# Patient Record
Sex: Male | Born: 1950 | Race: White | Hispanic: No | Marital: Married
Health system: Midwestern US, Academic
[De-identification: ages and names within clinical notes are randomized; demographics above are authoritative.]

---

## 2014-10-13 IMAGING — CR CHEST
2 series · 2 of 2 positions shown · non-contrast
Comparison: 21 August, 2009

EXAM: CHEST
REASON FOR EXAM: Fever, chills, sweat
TECHNIQUE: PA   Lateral

[chest pa x-wise]
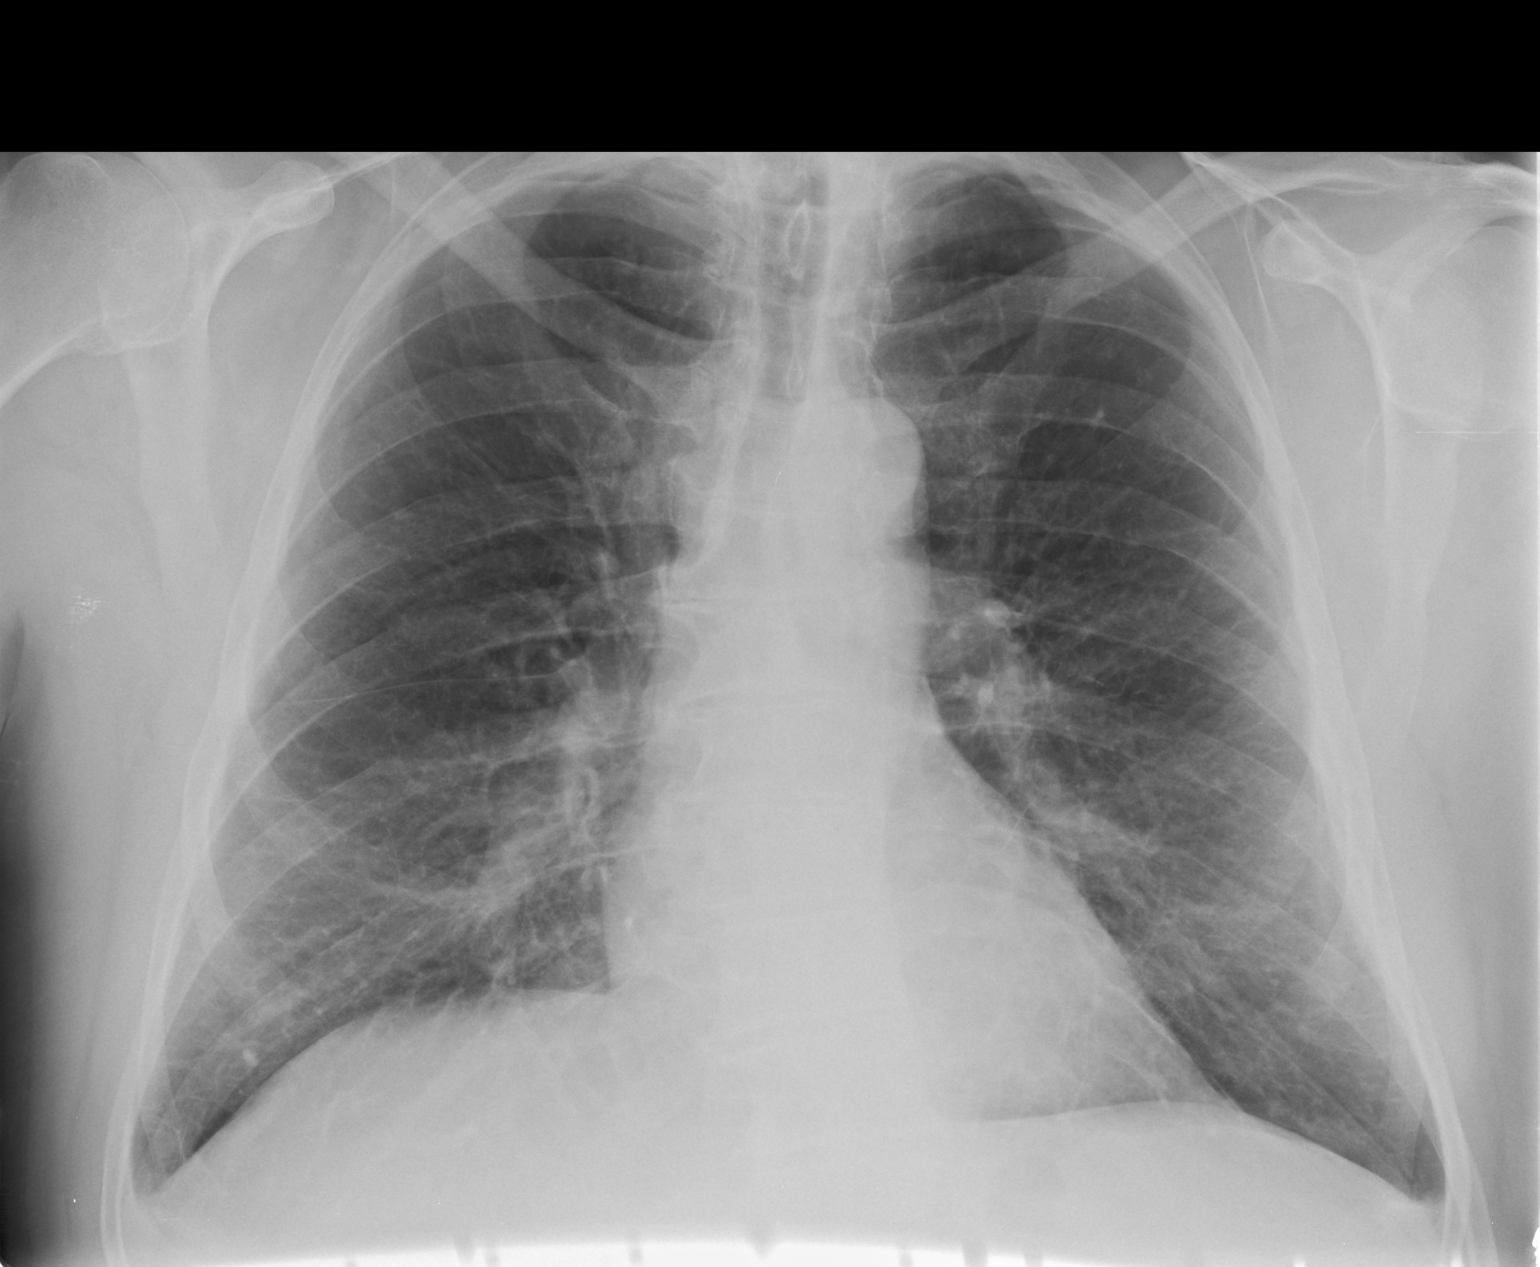

[chest lat]
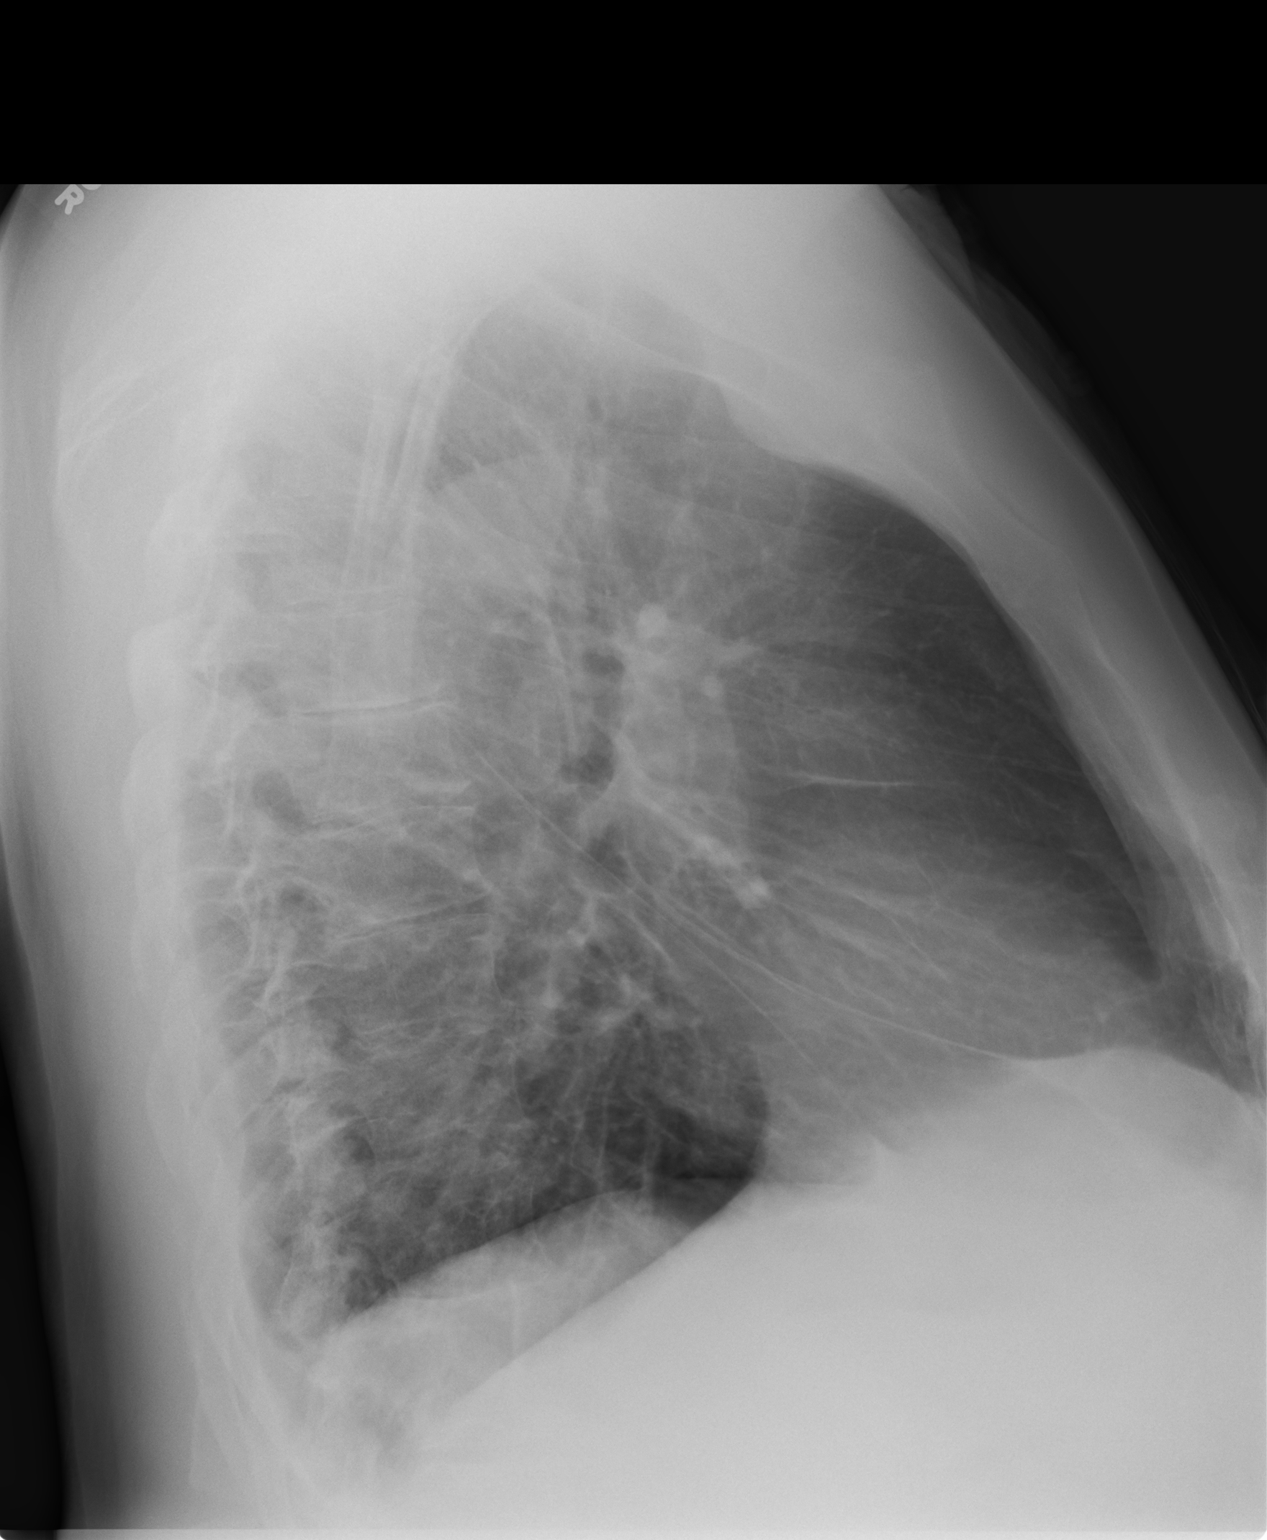

[2 of 2 positions shown; findings below may reference images not displayed]

IMPRESSION: Increased interstitial markings bilateral lung bases, likely representing
infiltrative process.
Cardiac silhouette and pulmonary vasculature within normal limits.
FINDINGS: There are increased interstitial markings within the bilateral lung bases,
likely representing an interstitial pneumonitis.  The cardiac silhouette and
pulmonary vasculature are within normal limits and stable when compared to
prior studies.  There are no effusions.

Dictated by Stefanie, Jackenson
Preliminary report until reviewed and verified by Stuart, Tatyana

Tech Notes: FEVER CHILLS SWEATS. JL/LR

## 2015-09-25 IMAGING — CT Abdomen^1_ABDOMEN_WITH (Adult)
1 of 2 series · 14 of 32 positions shown, 19 images · IV contrast (APPLIED)
Comparison: none

[Series 2: abdomen with 5.0 soft tissue · axial · 0.84mm/px · z∈[-351,-121]mm · 14 of 50 slices shown, 19 images]
[im 3/50  soft-tissue]
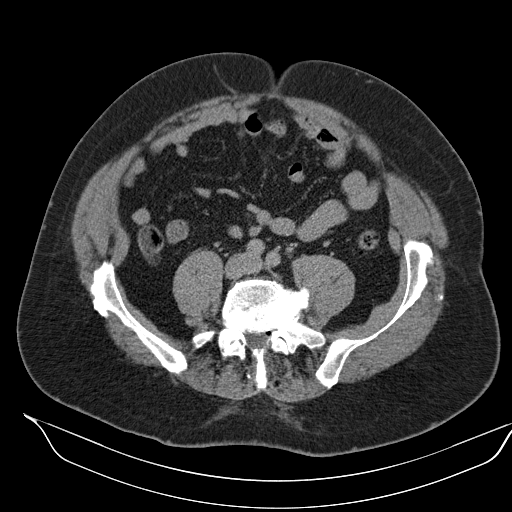
[im 3/50  bone]
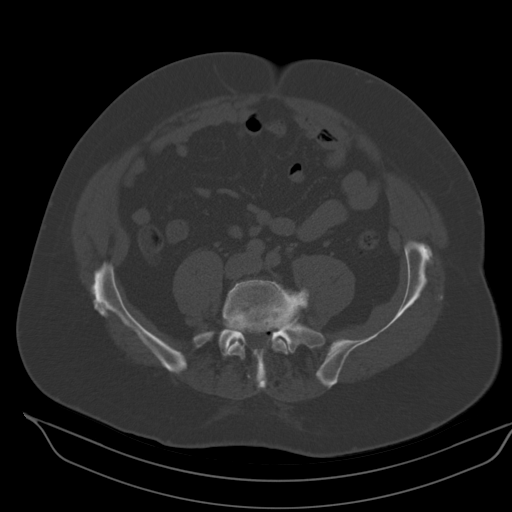
[im 8/50  soft-tissue]
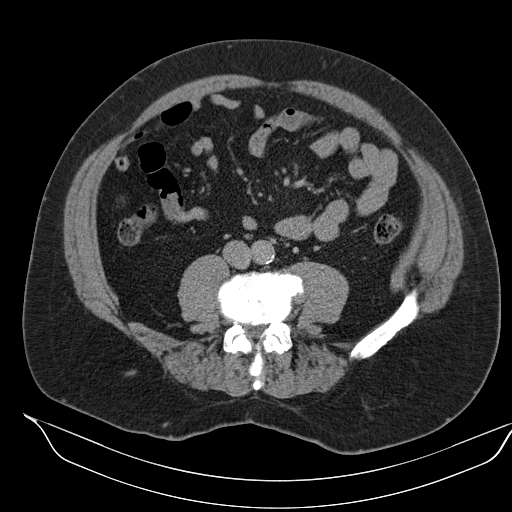
[im 10/50  soft-tissue]
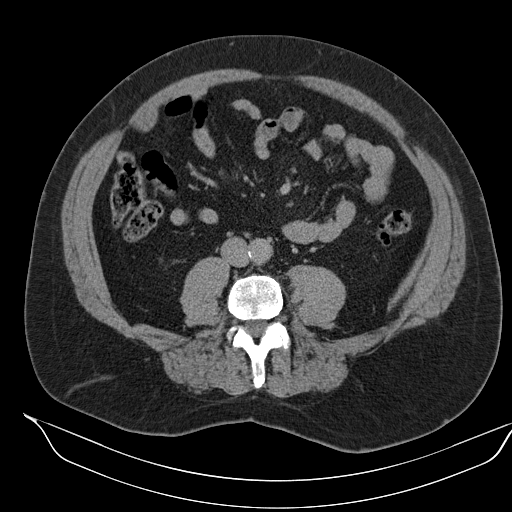
[im 15/50  soft-tissue]
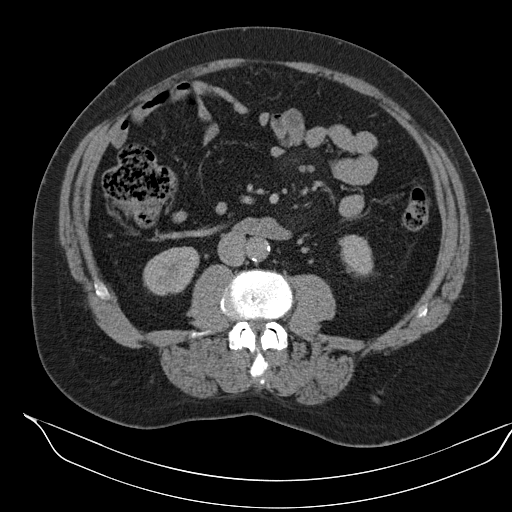
[im 18/50  soft-tissue]
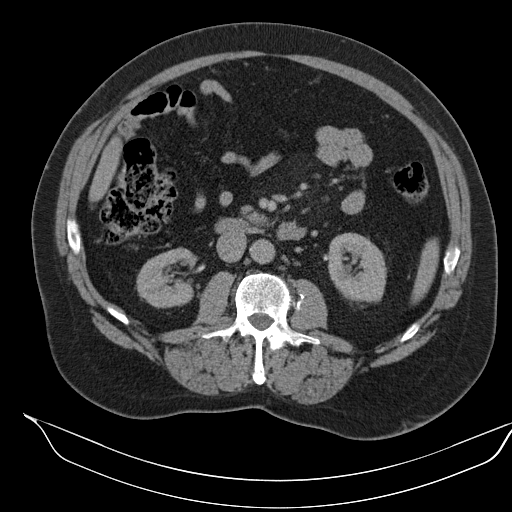
[im 23/50  soft-tissue]
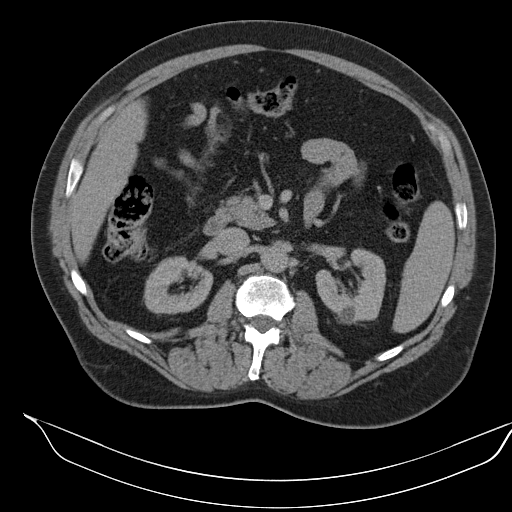
[im 25/50  soft-tissue]
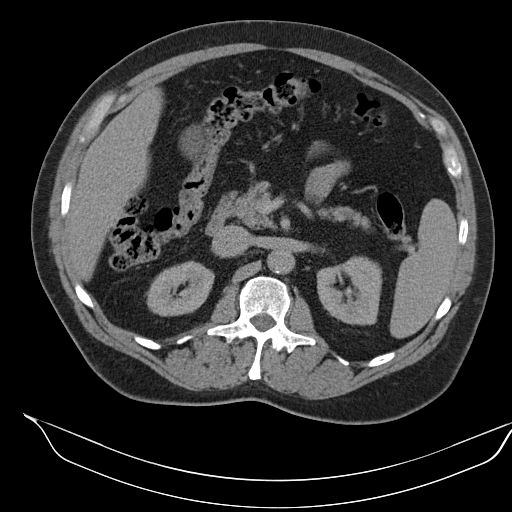
[im 27/50  soft-tissue]
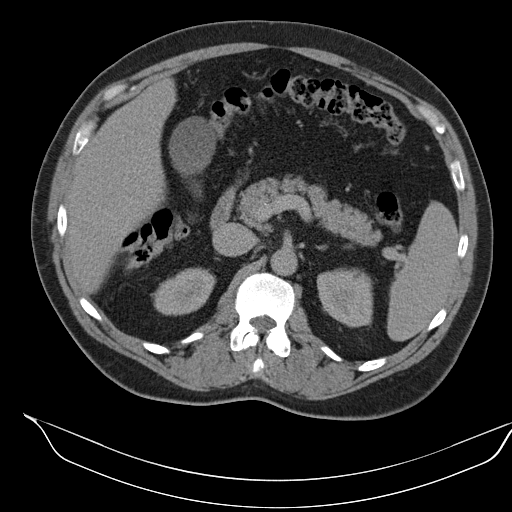
[im 32/50  soft-tissue]
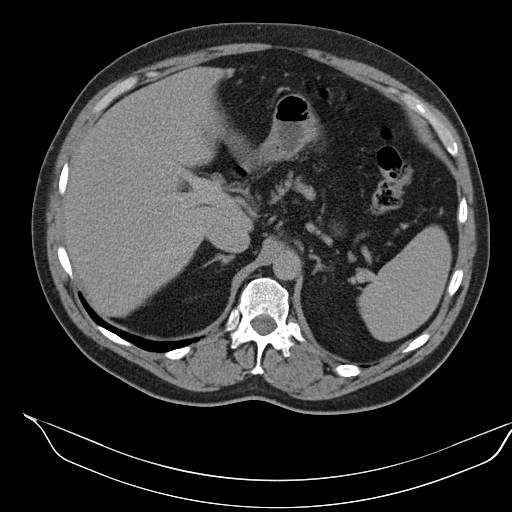
[im 32/50  bone]
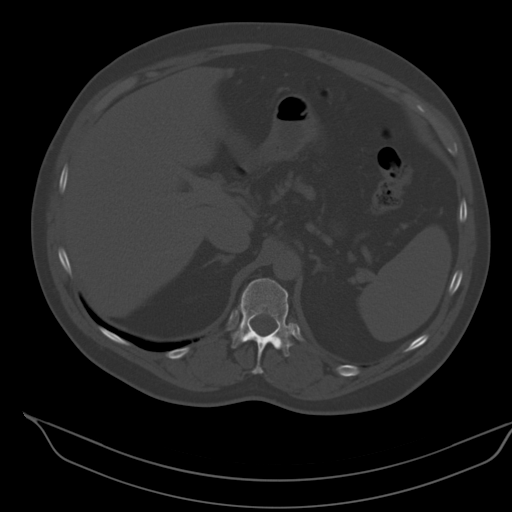
[im 35/50  soft-tissue]
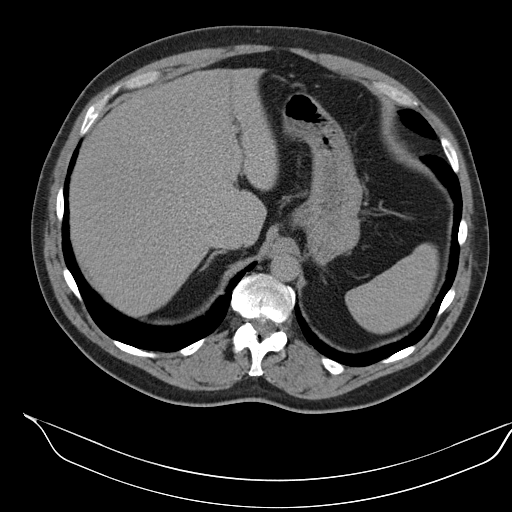
[im 40/50  soft-tissue]
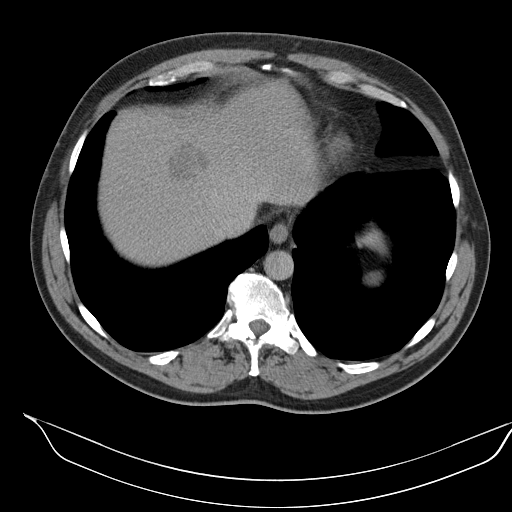
[im 40/50  lung]
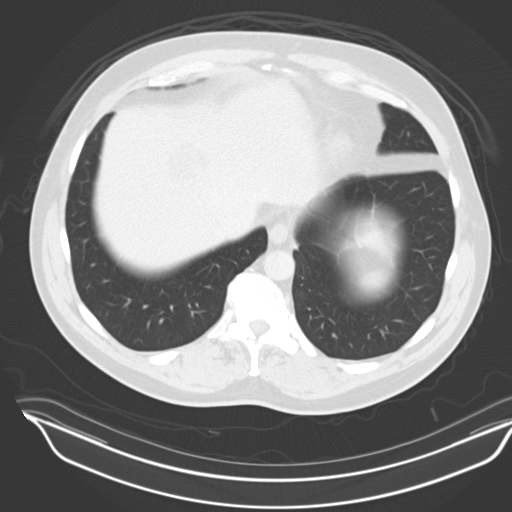
[im 42/50  soft-tissue]
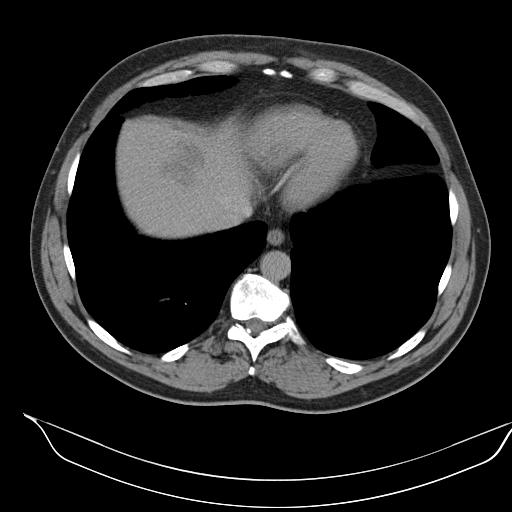
[im 42/50  lung]
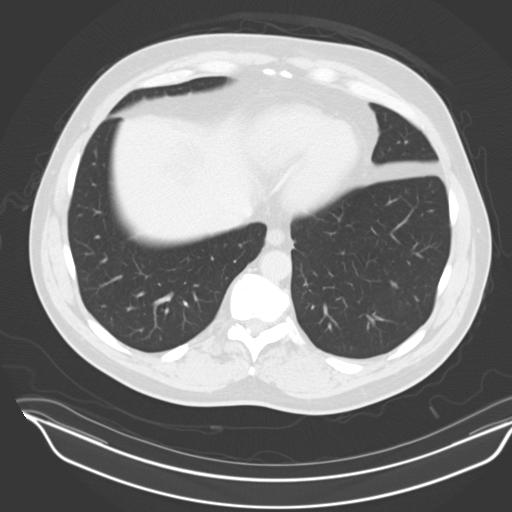
[im 45/50  lung]
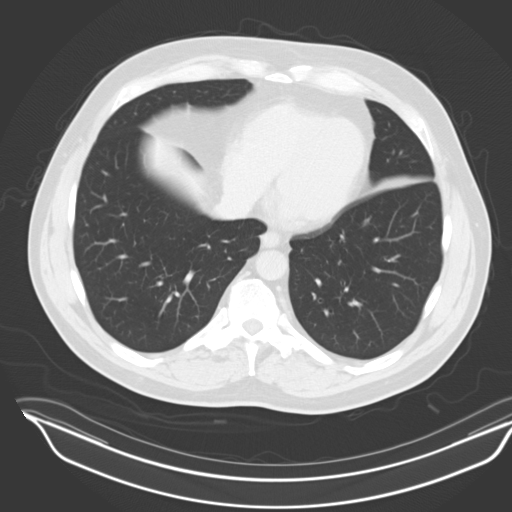
[im 47/50  soft-tissue]
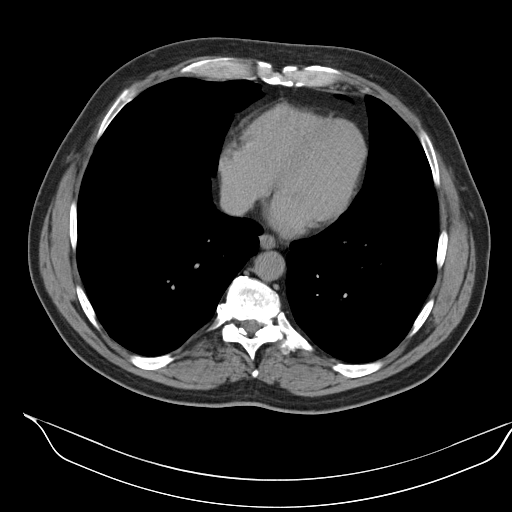
[im 47/50  lung]
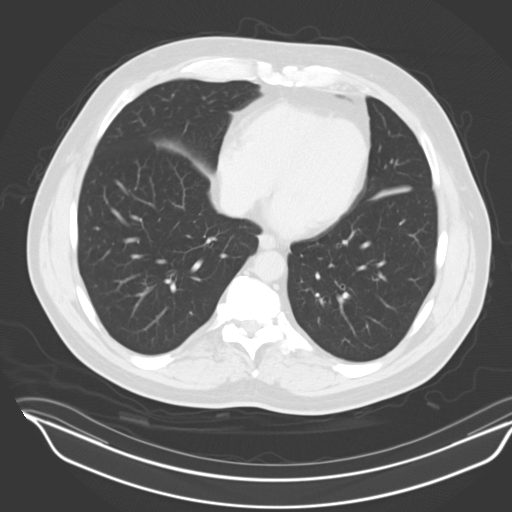

[14 of 32 positions shown; findings below may reference images not displayed]

DIAGNOSTIC STUDIES

EXAM
COMPUTED TOMOGRAPHY, ABDOMEN WITH CONTRAST MATERIAL

INDICATION
liver hemangioma, abd painLIVER HEMANGIOMA. LEFT ABD PAIN, EPIGASTRIC PAIN WORSE IN LAST SIX
MONTHS -AK

TECHNIQUE

All CT scans at this facility use dose modulation, iterative reconstruction, and/or weight based
dosing when appropriate to reduce radiation dose to as low as reasonably achievable.

COMPARISONS
No priors available for comparison.

FINDINGS
[The lung bases are clear. The liver is normal in size without evidence of intrahepatic biliary
ductal dilatation. There is poor enhancement of the hepatic and portal veins. There is a
centimeter low-attenuation lesion within the hepatic dome, image 10. There is the suggestion of
peripheral puddling of contrast on the initial images. The spleen is normal in size. Subtle
punctate calcifications within the pancreas. The gallbladder and adrenal glands appear grossly
unremarkable. There is no hydronephrosis. 1.7 centimeter cyst within the upper pole of the left
kidney. There is no bowel obstruction or free air. Normal appendix. There is a nondistended stomach
with at least mild gastric mucosal thickening. Mild to moderate degree of atherosclerotic disease
within the distal abdominal aorta. Subcentimeter retroperitoneal nodes. There is minimal mesenteric
stranding. There are subcentimeter mesenteric nodes. Severe multilevel degenerative changes within
the lower lumbar spine.

IMPRESSION
1. 3.8 centimeter low-attenuation lesion within the hepatic dome, which may represent a
hemangioma. Comparison to prior studies versus a four-month follow-up can be performed to document
stability.
2. Subtle punctate calcifications within the pancreas, secondary to chronic pancreatitis.
Correlation with the patient's serum amylase and lipase levels is recommended to exclude acute
pancreatitis.
3. Nondistended stomach. There is at least mild gastric mucosal thickening, consistent with
gastritis.

## 2016-09-15 ENCOUNTER — Encounter: Admit: 2016-09-15 | Discharge: 2016-09-15 | Payer: MEDICARE

## 2016-09-16 ENCOUNTER — Encounter: Admit: 2016-09-16 | Discharge: 2016-09-16 | Payer: MEDICARE

## 2016-09-22 ENCOUNTER — Encounter: Admit: 2016-09-22 | Discharge: 2016-09-22 | Payer: MEDICARE

## 2016-09-22 DIAGNOSIS — K219 Gastro-esophageal reflux disease without esophagitis: ICD-10-CM

## 2016-09-22 DIAGNOSIS — Z0389 Encounter for observation for other suspected diseases and conditions ruled out: ICD-10-CM

## 2016-09-22 DIAGNOSIS — IMO0002 Ulcer: Principal | ICD-10-CM

## 2016-09-22 DIAGNOSIS — E785 Hyperlipidemia, unspecified: ICD-10-CM

## 2016-09-22 DIAGNOSIS — N429 Disorder of prostate, unspecified: ICD-10-CM

## 2016-09-22 DIAGNOSIS — R0600 Dyspnea, unspecified: ICD-10-CM

## 2016-09-22 DIAGNOSIS — R12 Heartburn: ICD-10-CM

## 2016-09-22 DIAGNOSIS — N189 Chronic kidney disease, unspecified: ICD-10-CM

## 2016-09-22 DIAGNOSIS — I00 Rheumatic fever without heart involvement: ICD-10-CM

## 2016-09-27 ENCOUNTER — Ambulatory Visit: Admit: 2016-09-27 | Discharge: 2016-09-28 | Payer: MEDICARE

## 2016-09-27 ENCOUNTER — Encounter: Admit: 2016-09-27 | Discharge: 2016-09-27 | Payer: MEDICARE

## 2016-09-27 DIAGNOSIS — E785 Hyperlipidemia, unspecified: ICD-10-CM

## 2016-09-27 DIAGNOSIS — Z9189 Other specified personal risk factors, not elsewhere classified: Principal | ICD-10-CM

## 2016-09-27 DIAGNOSIS — I00 Rheumatic fever without heart involvement: ICD-10-CM

## 2016-09-27 DIAGNOSIS — K219 Gastro-esophageal reflux disease without esophagitis: ICD-10-CM

## 2016-09-27 DIAGNOSIS — N429 Disorder of prostate, unspecified: ICD-10-CM

## 2016-09-27 DIAGNOSIS — R0600 Dyspnea, unspecified: ICD-10-CM

## 2016-09-27 DIAGNOSIS — N189 Chronic kidney disease, unspecified: ICD-10-CM

## 2016-09-27 DIAGNOSIS — Z0389 Encounter for observation for other suspected diseases and conditions ruled out: ICD-10-CM

## 2016-09-27 DIAGNOSIS — IMO0002 Ulcer: Principal | ICD-10-CM

## 2016-09-27 DIAGNOSIS — R12 Heartburn: ICD-10-CM

## 2016-09-27 MED ORDER — ASPIRIN 325 MG PO TBEC
325 mg | ORAL_TABLET | Freq: Every day | ORAL | 3 refills | 30.00000 days | Status: AC
Start: 2016-09-27 — End: 2019-01-03

## 2016-09-27 MED ORDER — ROSUVASTATIN 10 MG PO TAB
ORAL_TABLET | Freq: Every day | ORAL | 3 refills | 90.00000 days | Status: AC
Start: 2016-09-27 — End: 2017-01-23

## 2016-09-27 NOTE — Progress Notes
Date of Service: 09/27/2016    Joe Whitehead is a 66 y.o. male.       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  He has never had any diagnosed coronary disease or even hypertension.  He had stress echoes in 2004 and 2012 that were read by Dr. Kennedy Whitehead.  All findings were normal.  In the past year he has noticed increased exertional dyspnea when walking up a slope in the back of his yard.  This is never been an issue for him under any circumstances until the last year.  He intermittently finds himself short of breath walking in a routine pace for no apparent reason.  He is also noticed some ankle edema that is cyclic daily resolving for the most part at night and recurring each morning and progressing as the day goes on.  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    He states that he is free from exertional chest pain or pressure that limits activity.  He is having no PND, orthopnea,   tachypalpitations, syncope, near syncope, or postural lightheadedness.  He has had no  focal motor defects, speech defects or cognitive defects that would suggest TIA. He's not limiting his activity in order to avoid chest discomfort or shortness of breath with exertion.    His general health is good.  He has had issues with esophageal and pancreatic inflammation.  He is mild elevation in creatinine with GFR slightly under 60 per his history.  He had a  marginally positive sleep study when he was having some sleep disturbances .  He did not tolerate CPAP.  His sleep disturbances have resolved and is not pursuing any further evaluation of possible sleep apnea.  His general health is however good with these caveats. He is not under undue stress.  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    He has had significant arthralgias with both the simvastatin and lovastatin.  The extent of these symptoms I discouraged him from trying alternative therapies with less troublesome drugs at less than daily dosing.   04/01/2016    Cholesterol 217 (H)   Triglycerides 131   HDL 56   LDL 134 (H)     His parents both died of tobacco related diseases.  He is a lifetime non-smoker.  He stopped drinking about a year ago.          Vitals:    09/27/16 0914   BP: 118/72   Pulse: 71   Weight: 115.4 kg (254 lb 6.4 oz)   Height: 1.803 m (5' 11)     Body mass index is 35.48 kg/m???.     Past Medical History  Patient Active Problem List    Diagnosis Date Noted   ??? Hyperlipemia 09/22/2016   ??? GERD (gastroesophageal reflux disease) 09/22/2016   ??? DOE (dyspnea on exertion) 09/22/2016   ??? Observation for suspected cardiovascular disease 09/22/2016     03/19/2010  Exercise Echo:  NSR PVC's noted throughout study.  Normal LV sz and function.  Normal hyperdynamic response for all segments.  No Echocardiographic evidence of ischemia.             Review of Systems   Constitution: Negative.   HENT: Negative.    Eyes: Negative.    Cardiovascular: Negative.    Respiratory: Positive for shortness of breath.    Endocrine: Negative.    Hematologic/Lymphatic: Negative.    Skin: Negative.    Musculoskeletal: Positive for arthritis, back pain, joint pain, muscle cramps and stiffness.   Gastrointestinal: Negative.    Genitourinary: Positive for frequency.   Neurological: Negative.    Psychiatric/Behavioral: Negative.    Allergic/Immunologic: Negative.    14 organ system review noted. It is negative except as reported in current narrative or above in the ROS section. This is a patient centered review of systems that was stated by the patient in his terms prior to my personal problem oriented interview with the patient     Physical Exam  General: Patient in no distress, looks generally healthy. Skin warm and dry.    Mucous membranes moist.  Eyes: Sclera non icteric,Pupils equal and round    Carotids: no bruits    Thyroid not enlarged.  Neck veins: CVP <6 normal, no V wave, no HJR     Respiratory: Breathing comfortably. Lungs clear to percussion & auscultation. No rales, rhonchi or wheezing   Cardiac: Regular rhythm. LV impulse not palpable. Normal S1 & S2, Fourth heart sound, no rub or S3. No murmur  Abdomen: soft, non-tender, no masses,bruits,hepatic or aortic enlargement. + bowel sounds.   Femoral arteries: Good pulses, no bruits.  Legs/feet: Normal PT pulses, 2-3 mm ankle edema extending to midleg  Motor: Normal muscle strength. Cognitive: Pleasant demeanor. Good insight. No depression     Cardiovascular Studies  Today's 12 lead EKG: sinus rhythm, rate 71, one PVC    Problems Addressed Today  No diagnosis found.    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  I did not mention above that he had been doing an exercise program involving stamina training and resistance work but in the last year has cut cut that back to nearly nil and invested this time and getting 7 hour sleep.  The he feels more rested and a healthier with the increase in sleep and reduction exercise.  Nevertheless his symptoms could be related to deconditioning.  Regardless time management is fairly clear that having a regular physical exercise program reduce his risk of coronary disease and some cancers and is thought to perhaps reduce risk of dementia.  I cited all of this data to Dr. Andreas Whitehead who already knew it but had not been processing it for himself    His blood pressure looks good.  I do not think he is been hypertensive at other times.  We will see wall thickness on the echo to be certain that he has not developed LVH since his last stress echo.  At that time he did not have LVH.    I did a calculation of his ASCVD ten-year risk.  His risk came out at 11% dropping to about 8% with initiation of statin based on the results of the [facility]  mobile app.  He agreed that some trial of statin I would be reasonable expecting that with even a moderated dose of statin and Zetia he could get his LDL down by 40-50%.  I recommended that he start generic Crestor 10 mg Monday Wednesday Friday.  If his LDL is not substantively below 100 then I will add Zetia 10 mg daily the expectation that his LDL will likely be around 16% of his baseline once he is on steady state Crestor plus Zetia generics.  I told Dr. Andreas Whitehead that statin to reduce LDL is truly primary prevention or is aspirin only reduces the complications once a flat growth and plaque rupture has occurred.    He will have the stress echo at [facility].  I will make every effort to review the results as soon as they are possibly available.    I recommended that he consider taking aspirin 325 mg daily for colon cancer prevention given his polyps.  He will check with his gastroenterologist Rod Mae on the need to take a higher dose for colon cancer prevention and history of with a history of polyps as her expertise on this greatly exceeds mine.    NB: This document was prepared within the Epic(TM) EMR using templates developed by the [facility]. It can be accessed online through [facility] feature by clinicians at other Epic institutions.  The free text in this document, was generated through Dragon(TM) software.  Editing and proofreading were done by the author of this document Dr. Mable Paris MD, [facility] principally at the point of care.  In spite of the author's best effort to identify every error introduced by voice to text dictation, some errors that may represent misspelling or misstatements of what was dictated may persist.  If there are questions about content in this document please contact Dr. Hale Bogus.    The written information I provided Mr. Plott at the conclusion of today's encounter is as  follows:   Patient Instructions   With your shortness of breath with exertion and the leg edema you need an echo Doppler and a treadmill stress echo.    I do not think the edema require specific treatment right now.  They you could consider 3 times a week hydrochlorothiazide to reduce fluid volume without inducing a volume contraction.  With your tendency for mild rise mild elevation in creatinine I think it is important for you to avoid volume contraction with a diuretic when the edema is truly in need of treatment only for cosmetic and not medical purposes.    Aspirin 325 will be more effective based on my review literature than aspirin 81 for preventing colon cancer development.  Treatment actually takes 5-10 years to become evident so it is better to start now.    Your hypercholesterolemia gives you a 11% ten-year risk.  You can reduce this by about 25-30% by starting Crestor dose it will reduce LDL substantively below 100 try 10 mg daily or if you wish at 10 mg 3 times a week to avoid myalgias.  We can add Zetia 10 daily if LDL reduction on your preferred dose of Crestor is insufficient.    Your blood pressure looks good.  You had no evidence of LVH on prior echoes I suspect the echo we get in the near future will also show normal wall thickness.    3 hours a week of some form of physical activity is advisable.  If she can  hit 10,000 steps a day on your fit bit that will cover it but I think it may be easier for you to just go out and exercise and end up deciding to walk 3 miles at 9:00 at night when you see you're short on steps.    Repeat labs just before you see me in 3 months    It's good to see you today.   Marissa Nestle, MD              Current Medications (including today's revisions)  ??? acetaminophen SR(+) (TYLENOL ARTHRITIS PAIN) 650 mg tablet Take 650 mg by mouth every 48 hours.   ??? ERGOCALCIFEROL (VITAMIN D2) (VITAMIN D PO) Take 1,000 Units by mouth daily.   ??? esomeprazole DR(+) (NEXIUM) 20 mg capsule Take 20 mg by mouth as Needed. Take on an empty stomach at least 1 hour before or 2 hours after food.    ??? glucosamine/chondr su A sod (OSTEO BI-FLEX PO) Take 1 tablet by mouth daily.

## 2016-09-30 ENCOUNTER — Encounter: Admit: 2016-09-30 | Discharge: 2016-09-30 | Payer: MEDICARE

## 2016-09-30 DIAGNOSIS — R0600 Dyspnea, unspecified: ICD-10-CM

## 2016-09-30 DIAGNOSIS — IMO0002 Ulcer: Principal | ICD-10-CM

## 2016-09-30 DIAGNOSIS — K219 Gastro-esophageal reflux disease without esophagitis: ICD-10-CM

## 2016-09-30 DIAGNOSIS — Z0389 Encounter for observation for other suspected diseases and conditions ruled out: ICD-10-CM

## 2016-09-30 DIAGNOSIS — I00 Rheumatic fever without heart involvement: ICD-10-CM

## 2016-09-30 DIAGNOSIS — N189 Chronic kidney disease, unspecified: ICD-10-CM

## 2016-09-30 DIAGNOSIS — R12 Heartburn: ICD-10-CM

## 2016-09-30 DIAGNOSIS — E785 Hyperlipidemia, unspecified: ICD-10-CM

## 2016-09-30 DIAGNOSIS — N429 Disorder of prostate, unspecified: ICD-10-CM

## 2016-10-05 ENCOUNTER — Encounter: Admit: 2016-10-05 | Discharge: 2016-10-05 | Payer: MEDICARE

## 2016-10-05 NOTE — Telephone Encounter
Notified pt of recs noted below. No further questions or concerns.

## 2016-10-05 NOTE — Telephone Encounter
Pt leaves message reporting that he started himself on Aspirin 80 mg to assist in prevention of colon polyps. However, pt reports that Dr. Starleen Blue recommended that he take Aspirin 325 mg but he is hesitant to do so due to his gastritis.     Pt questioning Dr. Marisue Brooklyn thoughts regarding this.

## 2016-10-05 NOTE — Telephone Encounter
Dr. Starleen Blue is cardiology, I feel comfortable in 325mg  ASA daily. Would continue his esomeprazole 20mg , he was taking this every  other day per his comfort but I feel very comfortable in him taking this daily to help prevent ulcers but ASA is lower risk than NSAIDS.

## 2016-10-19 ENCOUNTER — Encounter: Admit: 2016-10-19 | Discharge: 2016-10-19 | Payer: MEDICARE

## 2016-10-19 DIAGNOSIS — Z0389 Encounter for observation for other suspected diseases and conditions ruled out: Principal | ICD-10-CM

## 2016-10-19 NOTE — Progress Notes
Stress echo done 10/11/2016 at [facility] Normal sinus rhythm.  Normal LV size and systolic function with normal wall thickness at rest.  No ECG evidence of ischemia.  No echocardiographic evidence of ischemia. Results sent to [facility] for review and copy of report sent for scanning.

## 2016-11-17 ENCOUNTER — Ambulatory Visit: Admit: 2016-11-17 | Discharge: 2016-11-17 | Payer: MEDICARE

## 2016-11-17 ENCOUNTER — Encounter: Admit: 2016-11-17 | Discharge: 2016-11-17 | Payer: MEDICARE

## 2016-11-17 DIAGNOSIS — E785 Hyperlipidemia, unspecified: ICD-10-CM

## 2016-11-17 DIAGNOSIS — Z0389 Encounter for observation for other suspected diseases and conditions ruled out: ICD-10-CM

## 2016-11-17 DIAGNOSIS — IMO0002 Ulcer: Principal | ICD-10-CM

## 2016-11-17 DIAGNOSIS — N401 Enlarged prostate with lower urinary tract symptoms: Principal | ICD-10-CM

## 2016-11-17 DIAGNOSIS — R0600 Dyspnea, unspecified: ICD-10-CM

## 2016-11-17 DIAGNOSIS — N429 Disorder of prostate, unspecified: ICD-10-CM

## 2016-11-17 DIAGNOSIS — I00 Rheumatic fever without heart involvement: ICD-10-CM

## 2016-11-17 DIAGNOSIS — R12 Heartburn: ICD-10-CM

## 2016-11-17 DIAGNOSIS — N189 Chronic kidney disease, unspecified: ICD-10-CM

## 2016-11-17 DIAGNOSIS — K219 Gastro-esophageal reflux disease without esophagitis: ICD-10-CM

## 2016-11-17 MED ORDER — CEFAZOLIN INJ 1GM IVP
2 g | Freq: Once | INTRAVENOUS | 0 refills | Status: CN
Start: 2016-11-17 — End: ?

## 2016-11-17 NOTE — Progress Notes
Date of Service: 11/17/2016     Subjective:             Joe Whitehead is a 66 y.o. male.    Chief Complaint   Patient presents with   ??? Benign Prostatic Hypertrophy       History of Present Illness    66 yo male with history of LUTS secondary to BPH.  He describes years of prostate problems with history of prostatitis, weak stream, hesitancy, post void dribbling, nocturia.  He has been on avodart and flomax for several years.  He also complains of discomfort with ejaculation.  Denies hematuria, UTI, bladder stones, AUR.  Has undergone cystoscopy showing large median lobe.      AUA ss 15, bother 4  SHIM 24  UA trace blood, PVR 49 mL  PSA 3.31    PMHx:  Past Medical History:   Diagnosis Date   ??? 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 DOE (dyspnea on exertion) 09/22/2016   ??? GERD (gastroesophageal reflux disease) 09/22/2016   ??? Heartburn    ??? Hyperlipemia 09/22/2016   ??? Observation for suspected cardiovascular disease 09/22/2016    03/19/2010  Exercise Echo:  NSR PVC's noted throughout study.  Normal LV sz and function.  Normal hyperdynamic response for all segments.  No Echocardiographic evidence of ischemia.     ??? Prostate disorder    ??? Rheumatic fever    ??? Ulcer     Stomach Issues       SurgHx:  Past Surgical History:   Procedure Laterality Date   ??? CLAVICLE SURGERY  13 and 17 years    X2 Left   ??? KNEE ARTHROSCOPY      bilateral   ??? NERVE BIOPSY         Allergies:  No Known Allergies    SocHx:  Social History     Social History   ??? Marital status: Married     Spouse name: N/A   ??? Number of children: N/A   ??? 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 Smoking status: Never Smoker   ??? Smokeless tobacco: Never Used   ??? Alcohol use Yes   ??? Drug use: No   ??? Sexual activity: Not on file     Other Topics Concern   ??? Not on file     Social History Narrative   ??? No narrative on file        FamHx:  Family History   Problem Relation Age of Onset   ??? COPD Mother ??? COPD Father           Review of Systems    Constitutional: Negative for fever, chills, fatigue and unexpected weight change.   HENT: Negative for hearing loss, sore throat and voice change.    Eyes: Negative for visual disturbance.   Respiratory: Negative for cough, shortness of breath and wheezing.    Cardiovascular: Negative for chest pain, palpitations and leg swelling.   Gastrointestinal: Negative for nausea, vomiting, abdominal pain, diarrhea and constipation.   Endocrine: Negative for polydipsia and polyuria.   Musculoskeletal: Negative for back pain, joint swelling and arthralgias.   Skin: Negative for rash.   Neurological: Negative for dizziness, seizures, weakness, light-headedness and headaches.   Hematological: Negative for adenopathy. Does not bruise/bleed easily.   Psychiatric/Behavioral: Negative for suicidal ideas  and confusion.       Objective:         ??? acetaminophen SR(+) (TYLENOL ARTHRITIS PAIN) 650 mg tablet Take 650 mg by mouth every 48 hours.   ??? aspirin EC 325 mg tablet Take 1 tablet by mouth daily. Take with food.   ??? ERGOCALCIFEROL (VITAMIN D2) (VITAMIN D PO) Take 1,000 Units by mouth daily.   ??? esomeprazole DR(+) (NEXIUM) 20 mg capsule Take 20 mg by mouth as Needed. Take on an empty stomach at least 1 hour before or 2 hours after food.    ??? glucosamine/chondr su A sod (OSTEO BI-FLEX PO) Take 1 tablet by mouth daily.   ??? rosuvastatin (CRESTOR) 10 mg tablet Take one daily or as instructed.       Vitals:    11/17/16 0836   BP: 133/84   Pulse: 72   Weight: 114.8 kg (253 lb)   Height: 180.3 cm (71)       Body mass index is 35.29 kg/m???.     Physical Exam    Gen - NAD  HENT - normocephalic  Neck - normal range of motion  Heart - normal rate  Lungs - normal effort  Abdomen - soft, non-distended, non-tender, no guarding, rebound or masses    DRE - smooth 30 gram prostate without nodules or tenderness  Ext - no edema, erythema or tenderness  Neuro - alert and oriented x 3  Skin - warm and dry Psych - normal behavior       Assessment and Plan:    Visit Dx:  1. Benign prostatic hyperplasia with weak urinary stream        I discussed Holmium Laser Enucleation of the Prostate (HoLEP) in detail with the patient.  During HoLEP, a laser is used to precisely remove the obstructive portion of the prostate, similar to open surgery without the need for incisions. A separate instrument called a morcellator is then used to cut the prostate tissue into easily removable fragments. With HoLEP, surgeons remove the entire portion of the prostate gland that can block urine flow, which improves urinary symptoms and provides a lasting solution as there is nothing to grow back following this procedure. In addition, HoLEP preserves removed tissue for microscopic examination. We discussed the risks of the procedure including but not limited to temporary or permanent incontinence, urinary retention, UTI, urethral stricture disease, retrograde ejaculation, erectile dysfunction, damage to the bladder, procedure failure, and need for retreatment.  We discussed the possibility of a staged procedure for continued resection vs morcellation.  We discussed the possibility of a small open incision for removal of the prostate adenoma.    F/U for TRUS for volume              Orders Placed This Encounter   ??? POC URINE DIPSTICK       Vonna Drafts, MD, MPH

## 2016-11-17 NOTE — Progress Notes
PVR 49 ML

## 2016-11-28 ENCOUNTER — Ambulatory Visit: Admit: 2016-11-28 | Discharge: 2016-11-28 | Payer: MEDICARE

## 2016-11-28 DIAGNOSIS — N401 Enlarged prostate with lower urinary tract symptoms: Principal | ICD-10-CM

## 2016-12-15 ENCOUNTER — Encounter: Admit: 2016-12-15 | Discharge: 2016-12-15 | Payer: MEDICARE

## 2017-01-17 ENCOUNTER — Encounter: Admit: 2017-01-17 | Discharge: 2017-01-17 | Payer: MEDICARE

## 2017-01-17 NOTE — Progress Notes
Pt requesting clearance for cystoscopy, TURP and Urolift.  Surgery date TBD.      10/11/2016 Stress echo done 10/11/2016 at [facility] Normal sinus rhythm.  Normal LV size and systolic function with normal wall thickness at rest.  No ECG evidence of ischemia.  No echocardiographic evidence of ischemia.     Last OV 09/27/2016    Flag sent to [facility] for recommendations.  [facility] is out of the office, but returns Monday.  Surgery not yet scheduled.

## 2017-01-23 ENCOUNTER — Encounter: Admit: 2017-01-23 | Discharge: 2017-01-23 | Payer: MEDICARE

## 2017-01-23 MED ORDER — EZETIMIBE 10 MG PO TAB
10 mg | ORAL_TABLET | Freq: Every day | ORAL | 3 refills | Status: AC
Start: 2017-01-23 — End: ?

## 2017-01-24 NOTE — Telephone Encounter
Spoke with Cloyde Reams, RN, at [facility] and they are needing documentation for the Upcoming surgery on 02/09/17. This is for cardiac clearance and the ASA hold for 5 days. This was faxed to [phone].

## 2017-11-10 ENCOUNTER — Encounter: Admit: 2017-11-10 | Discharge: 2017-11-10 | Payer: MEDICARE

## 2017-11-10 ENCOUNTER — Ambulatory Visit: Admit: 2017-11-10 | Discharge: 2017-11-11 | Payer: MEDICARE

## 2017-11-10 DIAGNOSIS — K219 Gastro-esophageal reflux disease without esophagitis: ICD-10-CM

## 2017-11-10 DIAGNOSIS — R12 Heartburn: ICD-10-CM

## 2017-11-10 DIAGNOSIS — Z0389 Encounter for observation for other suspected diseases and conditions ruled out: ICD-10-CM

## 2017-11-10 DIAGNOSIS — N429 Disorder of prostate, unspecified: ICD-10-CM

## 2017-11-10 DIAGNOSIS — E785 Hyperlipidemia, unspecified: ICD-10-CM

## 2017-11-10 DIAGNOSIS — IMO0002 Ulcer: Principal | ICD-10-CM

## 2017-11-10 DIAGNOSIS — N189 Chronic kidney disease, unspecified: ICD-10-CM

## 2017-11-10 DIAGNOSIS — R0600 Dyspnea, unspecified: ICD-10-CM

## 2017-11-10 DIAGNOSIS — I00 Rheumatic fever without heart involvement: ICD-10-CM

## 2017-11-11 DIAGNOSIS — K760 Fatty (change of) liver, not elsewhere classified: Principal | ICD-10-CM

## 2017-11-21 IMAGING — US ABDLM
1 series · 14 of 25 positions shown · non-contrast
Comparison: none

[Series 1: us abdomen limited · 14 of 62 slices shown]
[im 1/62]
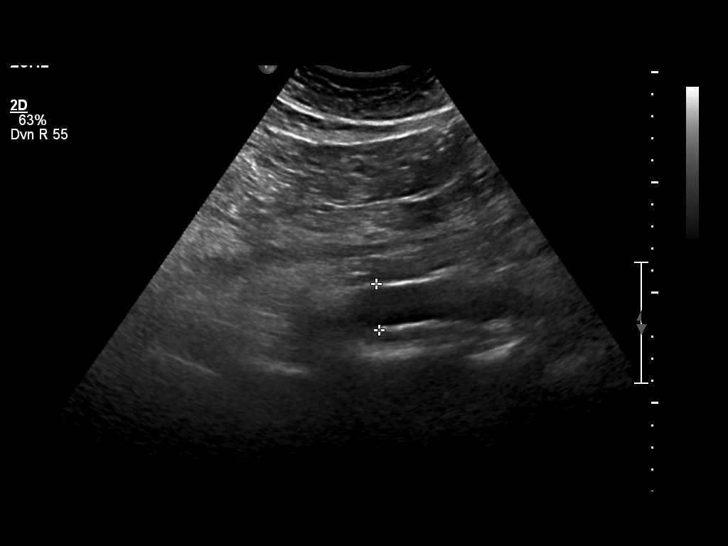
[im 6/62]
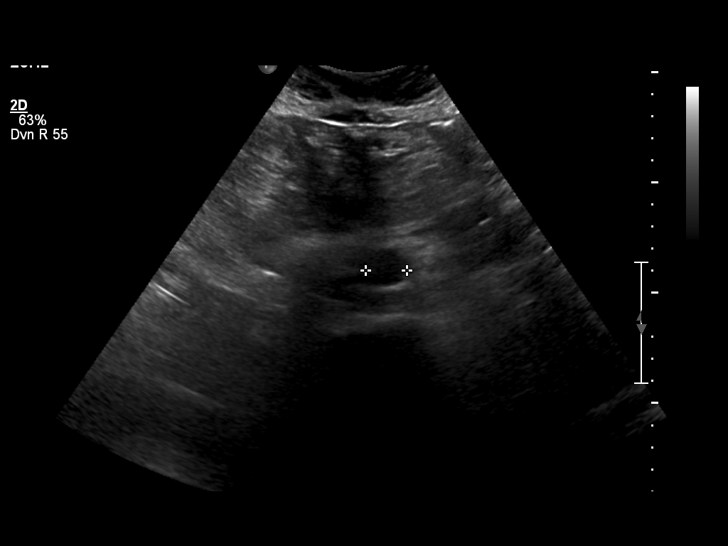
[im 11/62]
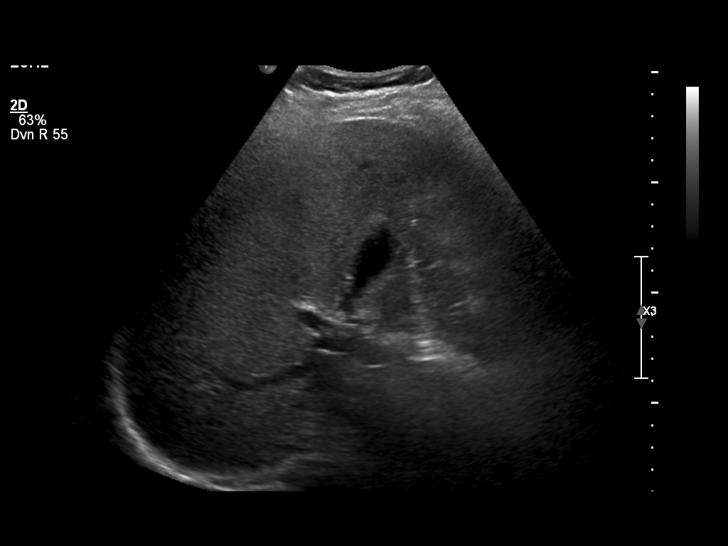
[im 16/62]
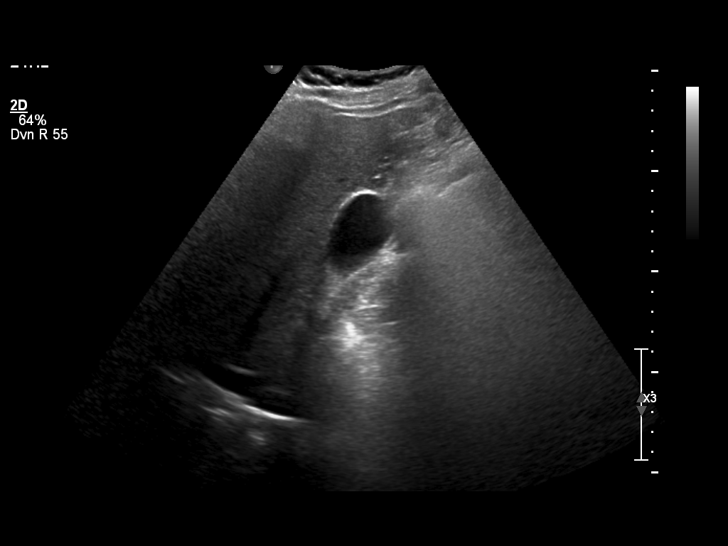
[im 21/62]
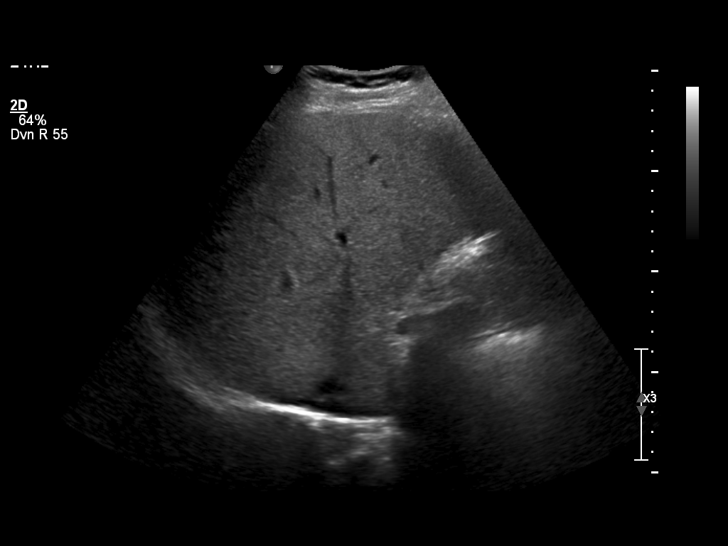
[im 23/62]
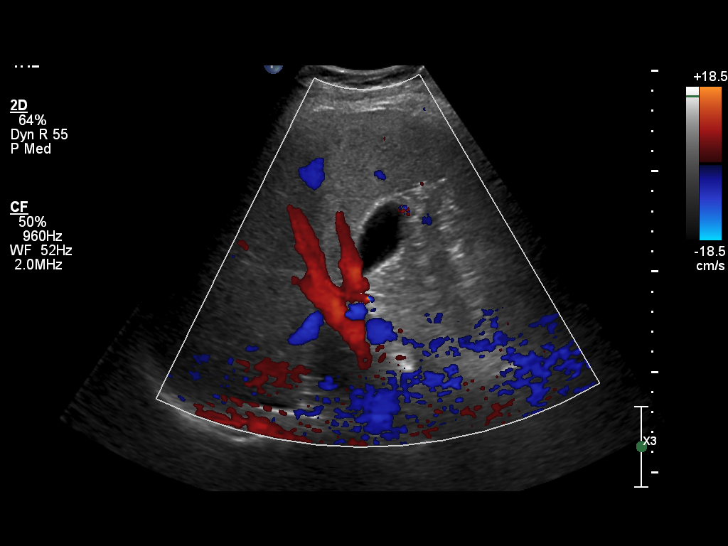
[im 28/62]
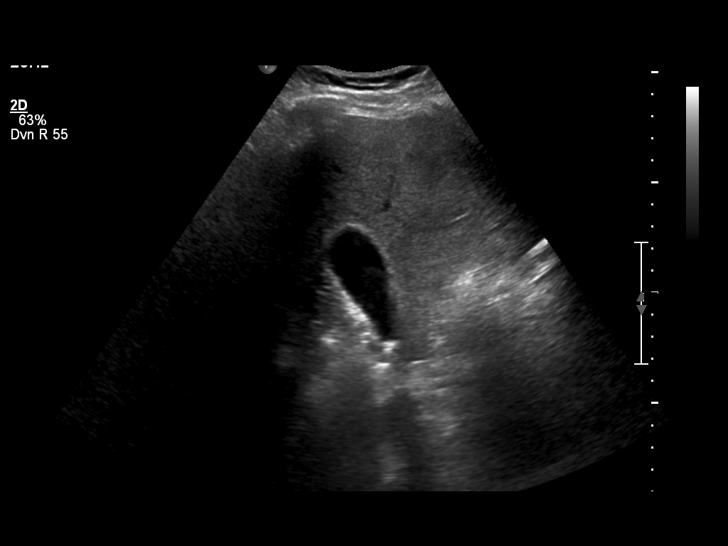
[im 34/62]
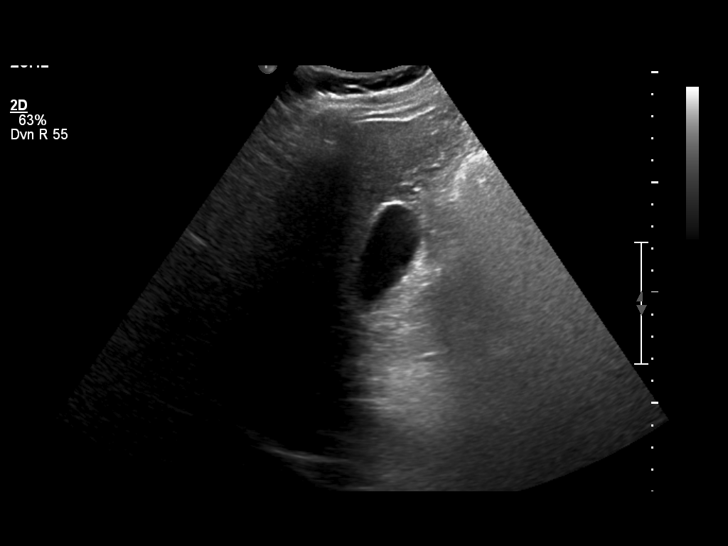
[im 39/62]
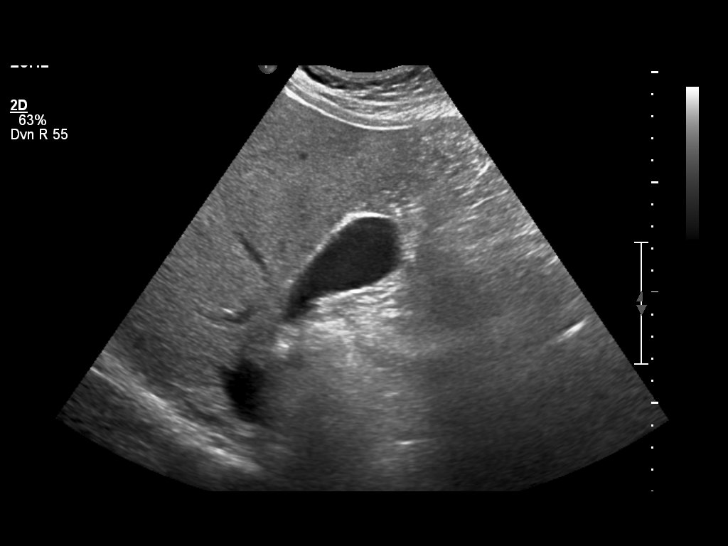
[im 41/62]
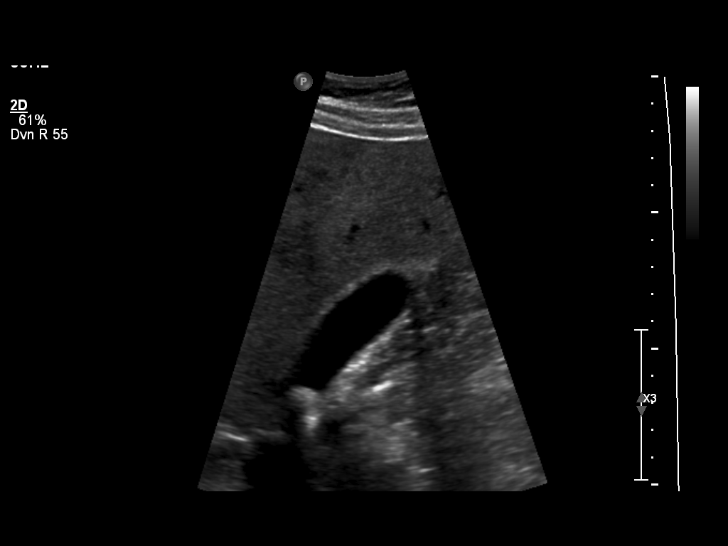
[im 46/62]
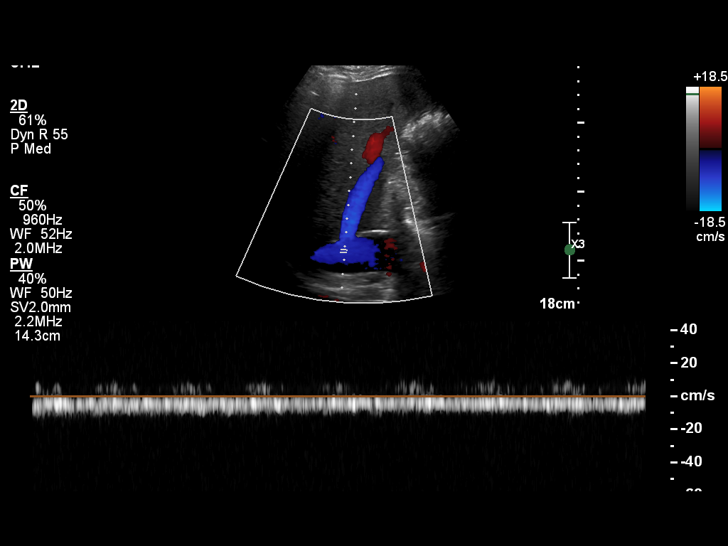
[im 51/62]
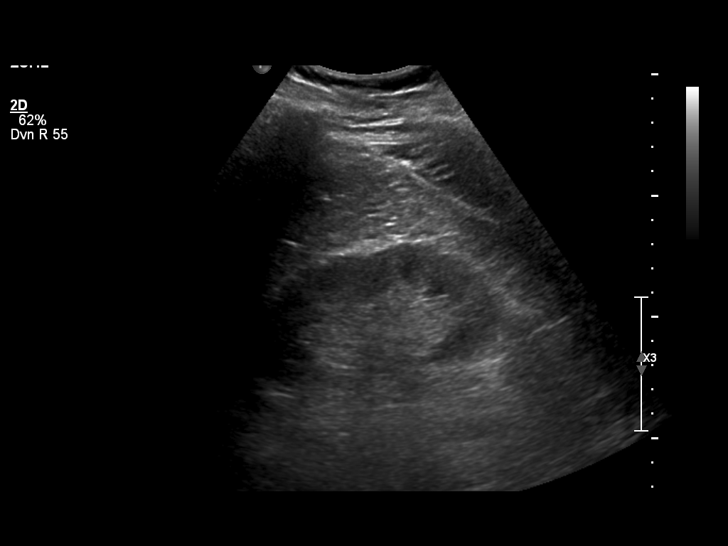
[im 56/62]
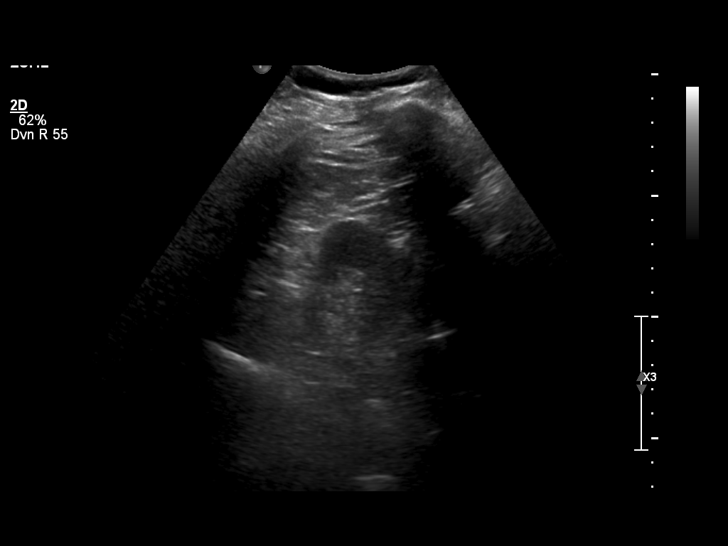
[im 62/62]
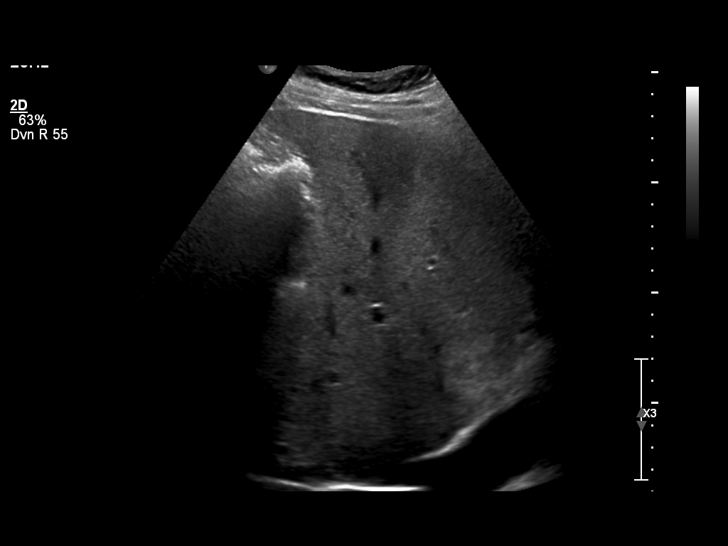

[14 of 25 positions shown; findings below may reference images not displayed]

EXAM
US ABDOMEN COMPLETE

INDICATION
Fatty liver

COMPARISONS
CT of the abdomen pelvis 09/25/2015

FINDINGS
The liver measures 19.5 cm,normal. There is increased echogenicity and there is no suspicious focal
lesion.

The common bile duct measures 0.4 cm. The gallbladder wall measures 0.3 cm. There is visualization
of a single posteriorly acoustic shadowing stone measuring up to 1.1 centimeters within the
gallbladder neck. No pericholecystic fluid. Negative sonographic Murphy's sign.

Limited evaluation of the pancreas secondary to overlying bowel gas and body habitus.

The right kidney measures 11.2 X5.3 X5.4 cm, Normal echogenicity.

The inferior vena cava is patent.

The maximal AP and transverse dimensions of the abdominal aorta measure 2.1 X2.7 cm, within normal
limits. No aneurysmal dilatation of the entire aorta.

IMPRESSION
- Increased echogenicity of the liver, which is nonspecific but may represent hepatic steatosis
and/or other hepatocellular disease.
- 1.1 centimeter stone within the gallbladder neck and correlate for biliary colic. No sonographic
evidence of acute cholecystitis.

Tech Notes:

jl

## 2018-11-15 ENCOUNTER — Encounter: Admit: 2018-11-15 | Discharge: 2018-11-15 | Payer: MEDICARE

## 2018-11-15 ENCOUNTER — Encounter: Admit: 2018-11-15 | Discharge: 2018-11-15

## 2018-11-15 DIAGNOSIS — Z1211 Encounter for screening for malignant neoplasm of colon: Secondary | ICD-10-CM

## 2018-11-15 DIAGNOSIS — Z1159 Encounter for screening for other viral diseases: Secondary | ICD-10-CM

## 2018-11-15 MED ORDER — SUPREP BOWEL PREP KIT 17.5-3.13-1.6 GRAM PO SOLR
1 | Freq: Once | ORAL | 0 refills | Status: AC
Start: 2018-11-15 — End: ?

## 2018-11-15 NOTE — Telephone Encounter
Received incoming VM from pt stating he tried to make an appointment with Dr. Glennon Mac this fall, and it was booked out. He said he was also needing to have his follow up colonoscopy for surveillance (last one 2017).   Outbound call to pt. Left detailed VM (authorization on file). Prep for case entered. Bowel prep sent to pharmacy on file. Prep instructions mailed to address on file.           LOV note with Dr. Glennon Mac 11/10/2017

## 2018-11-16 NOTE — Telephone Encounter
Received incoming VM from pt stating he did need to make an appointment with Dr. Glennon Mac for follow up. He verbalized he did get the message regarding the procedure.   Outbound Call to pt. LVM. Provided call back number.

## 2018-11-20 ENCOUNTER — Ambulatory Visit: Admit: 2018-11-20 | Discharge: 2018-11-20 | Payer: MEDICARE

## 2019-01-02 ENCOUNTER — Encounter: Admit: 2019-01-02 | Discharge: 2019-01-02 | Payer: MEDICARE

## 2019-01-02 DIAGNOSIS — Z1159 Encounter for screening for other viral diseases: Secondary | ICD-10-CM

## 2019-01-02 NOTE — Telephone Encounter
Contacted patient and confirmed name and DOB. Patient advised that COVID-19 test results are negative. Advised that patient can continue with the procedure and should follow pre-procedure instructions. Advised patient to continue with home quarantine until procedure. Advised that if they develop any concerning symptoms prior to the procedure to contact their procedure team, specialist, and/or PCP for assistance.        Torres, BSN, RN

## 2019-01-03 ENCOUNTER — Ambulatory Visit: Admit: 2019-01-03 | Discharge: 2019-01-03 | Payer: MEDICARE

## 2019-01-03 ENCOUNTER — Encounter: Admit: 2019-01-03 | Discharge: 2019-01-03 | Payer: MEDICARE

## 2019-01-03 DIAGNOSIS — R12 Heartburn: Secondary | ICD-10-CM

## 2019-01-03 DIAGNOSIS — K219 Gastro-esophageal reflux disease without esophagitis: Secondary | ICD-10-CM

## 2019-01-03 DIAGNOSIS — E785 Hyperlipidemia, unspecified: Secondary | ICD-10-CM

## 2019-01-03 DIAGNOSIS — I00 Rheumatic fever without heart involvement: Secondary | ICD-10-CM

## 2019-01-03 DIAGNOSIS — N189 Chronic kidney disease, unspecified: Secondary | ICD-10-CM

## 2019-01-03 DIAGNOSIS — Z0389 Encounter for observation for other suspected diseases and conditions ruled out: Secondary | ICD-10-CM

## 2019-01-03 DIAGNOSIS — R0600 Dyspnea, unspecified: Secondary | ICD-10-CM

## 2019-01-03 DIAGNOSIS — IMO0002 Ulcer: Secondary | ICD-10-CM

## 2019-01-03 DIAGNOSIS — N429 Disorder of prostate, unspecified: Secondary | ICD-10-CM

## 2019-01-03 MED ORDER — PROMETHAZINE 25 MG/ML IJ SOLN
6.25 mg | INTRAVENOUS | 0 refills | Status: CN | PRN
Start: 2019-01-03 — End: ?

## 2019-01-03 MED ORDER — FENTANYL CITRATE (PF) 50 MCG/ML IJ SOLN
25 ug | INTRAVENOUS | 0 refills | Status: CN | PRN
Start: 2019-01-03 — End: ?

## 2019-01-03 MED ORDER — FENTANYL CITRATE (PF) 50 MCG/ML IJ SOLN
50 ug | INTRAVENOUS | 0 refills | Status: CN | PRN
Start: 2019-01-03 — End: ?

## 2019-01-03 MED ORDER — PROPOFOL INJ 10 MG/ML IV VIAL
0 refills | Status: DC
Start: 2019-01-03 — End: 2019-01-03
  Administered 2019-01-03: 19:00:00 80 mg via INTRAVENOUS

## 2019-01-03 MED ORDER — PROPOFOL 10 MG/ML IV EMUL 20 ML (INFUSION)(AM)(OR)
INTRAVENOUS | 0 refills | Status: DC
Start: 2019-01-03 — End: 2019-01-03
  Administered 2019-01-03: 19:00:00 140 ug/kg/min via INTRAVENOUS

## 2019-01-03 MED ORDER — LIDOCAINE (PF) 200 MG/10 ML (2 %) IJ SYRG
0 refills | Status: DC
Start: 2019-01-03 — End: 2019-01-03
  Administered 2019-01-03: 19:00:00 60 mg via INTRAVENOUS

## 2019-01-03 MED ORDER — HALOPERIDOL LACTATE 5 MG/ML IJ SOLN
1 mg | Freq: Once | INTRAVENOUS | 0 refills | Status: CN | PRN
Start: 2019-01-03 — End: ?

## 2019-01-03 MED ORDER — ONDANSETRON HCL (PF) 4 MG/2 ML IJ SOLN
4 mg | Freq: Once | INTRAVENOUS | 0 refills | Status: CN | PRN
Start: 2019-01-03 — End: ?

## 2019-01-03 MED ORDER — LACTATED RINGERS IV SOLP
1000 mL | INTRAVENOUS | 0 refills | Status: DC
Start: 2019-01-03 — End: 2019-01-03
  Administered 2019-01-03: 19:00:00 1000.000 mL via INTRAVENOUS

## 2019-01-03 MED ORDER — OXYCODONE 5 MG PO TAB
5 mg | Freq: Once | ORAL | 0 refills | Status: CN | PRN
Start: 2019-01-03 — End: ?

## 2019-01-03 NOTE — Anesthesia Pre-Procedure Evaluation
Anesthesia Pre-Procedure Evaluation    Name: Joe Whitehead      MRN: [id number]     DOB: 21-Mar-1950     Age: 68 y.o.     Sex: male   _________________________________________________________________________     Procedure Info:   Procedure Information     Date/Time:  01/03/19 1410    Procedure:  Colonoscopy (N/A )    Location:  [facility] (IR) / ENDO/GI    Surgeon:  Tempie Hoist, DO          Physical Assessment  Vital Signs (last filed in past 24 hours):  BP: 148/84 (10/29 1400)  Temp: 37.2 ?C (98.9 ?F) (10/29 1400)  Pulse: 79 (10/29 1400)  Respirations: 20 PER MINUTE (10/29 1400)  SpO2: 97 % (10/29 1400)  Height: 180.3 cm (71) (10/29 1400)  Weight: 103.9 kg (229 lb) (10/29 1400)      Patient History   Allergies   Allergen Reactions   ? Crestor [Rosuvastatin] MUSCLE PAIN        Current Medications    Medication Directions   acetaminophen SR(+) (TYLENOL ARTHRITIS PAIN) 650 mg tablet Take 650 mg by mouth daily.   docusate (COLACE) 100 mg capsule Take 100 mg by mouth twice daily.   ERGOCALCIFEROL (VITAMIN D2) (VITAMIN D PO) Take 1,000 Units by mouth daily.   ezetimibe (ZETIA) 10 mg tablet Take one tablet by mouth daily.   glucosamine/chondr su A sod (OSTEO BI-FLEX PO) Take 1 tablet by mouth daily. Indications: per patient takes 2 tab per day         Review of Systems/Medical History      Patient summary reviewed  Nursing notes reviewed  Pertinent labs reviewed    PONV Screening: Non-smoker          Cardiovascular       Recent diagnostic studies:          stress test          Stress echo normal in 2018      Exercise tolerance: >4 METS      Beta Blocker therapy: No      Beta blockers within 24 hours: n/a      Hyperlipidemia      GI/Hepatic/Renal         GERD,      Renal disease (CKD Stage II)         Physical Exam    Airway Findings      Mallampati: III      TM distance: >3 FB      Neck ROM: full      Mouth opening: good      Airway patency: adequate    Cardiovascular Findings:       Rhythm: regular      Rate: normal Diagnostic Tests  Hematology:   Lab Results   Component Value Date    HGB 15.3 09/18/2015    HCT 46.8 09/18/2015    PLTCT 239 09/18/2015    WBC 6.8 09/18/2015    MCV 87.5 09/18/2015    MCH 28.6 09/18/2015    MCHC 32.7 09/18/2015    RDW 12.6 09/18/2015         General Chemistry:   Lab Results   Component Value Date    NA 139 01/12/2016    K 4.3 01/12/2016    CL 104 01/12/2016    CO2 26.0 01/12/2016    GAP 13 01/12/2016    BUN 17.0 01/12/2016    CR 1.27 01/12/2016  GLU 87 01/12/2016    GLU 93 05/29/2008    CA 9.9 01/12/2016    ALBUMIN 3.4 09/18/2015    TOTBILI 0.50 09/18/2015      Coagulation: No results found for: PT, PTT, INR      Anesthesia Plan    ASA score: 2   Plan: MAC  Induction method: intravenous  NPO status: acceptable      Informed Consent  Anesthetic plan and risks discussed with patient.        Plan discussed with: anesthesiologist and CRNA.

## 2019-01-03 NOTE — Anesthesia Post-Procedure Evaluation
Post-Anesthesia Evaluation    Name: Joe Whitehead      MRN: [id number]     DOB: [birth year]     Age: 68 y.o.     Sex: male   __________________________________________________________________________     Procedure Information     Anesthesia Start Date/Time:  01/03/19 1421    Procedures:       Colonoscopy (N/A )      COLONOSCOPY WITH SNARE REMOVAL TUMOR/ POLYP/ OTHER LESION      COLONOSCOPY WITH BIOPSY - FLEXIBLE    Location:  [facility] 6 (IR) / ENDO/GI    Surgeon:  Clide Dales, DO          Post-Anesthesia Vitals  BP: 122/80 (10/29 1510)  Temp: 36.6 C (97.9 F) (10/29 1457)  Pulse: 68 (10/29 1510)  Respirations: 19 PER MINUTE (10/29 1510)  SpO2: 98 % (10/29 1500)  SpO2 Pulse: 72 (10/29 1500)   Vitals Value Taken Time   BP 122/80 01/03/2019  3:10 PM   Temp 36.6 C (97.9 F) 01/03/2019  2:57 PM   Pulse 68 01/03/2019  3:10 PM   Respirations 19 PER MINUTE 01/03/2019  3:10 PM   SpO2 98 % 01/03/2019  3:00 PM         Post Anesthesia Evaluation Note    Evaluation location: Pre/Post  Patient participation: recovered; patient participated in evaluation  Level of consciousness: alert    Pain score: 0  Pain management: adequate    Hydration: normovolemia  Temperature: 36.0C - 38.4C  Airway patency: adequate    Perioperative Events       Post-op nausea and vomiting: no PONV    Postoperative Status  Cardiovascular status: hemodynamically stable  Respiratory status: spontaneous ventilation        Perioperative Events  Perioperative Event: No  Emergency Case Activation: No

## 2019-01-04 ENCOUNTER — Encounter: Admit: 2019-01-04 | Discharge: 2019-01-04 | Payer: MEDICARE

## 2019-01-04 DIAGNOSIS — N189 Chronic kidney disease, unspecified: Secondary | ICD-10-CM

## 2019-01-04 DIAGNOSIS — N429 Disorder of prostate, unspecified: Secondary | ICD-10-CM

## 2019-01-04 DIAGNOSIS — R12 Heartburn: Secondary | ICD-10-CM

## 2019-01-04 DIAGNOSIS — Z0389 Encounter for observation for other suspected diseases and conditions ruled out: Secondary | ICD-10-CM

## 2019-01-04 DIAGNOSIS — IMO0002 Ulcer: Secondary | ICD-10-CM

## 2019-01-04 DIAGNOSIS — R0600 Dyspnea, unspecified: Secondary | ICD-10-CM

## 2019-01-04 DIAGNOSIS — E785 Hyperlipidemia, unspecified: Secondary | ICD-10-CM

## 2019-01-04 DIAGNOSIS — K219 Gastro-esophageal reflux disease without esophagitis: Secondary | ICD-10-CM

## 2019-01-04 DIAGNOSIS — I00 Rheumatic fever without heart involvement: Secondary | ICD-10-CM

## 2019-01-07 ENCOUNTER — Encounter: Admit: 2019-01-07 | Discharge: 2019-01-07 | Payer: MEDICARE

## 2020-07-31 IMAGING — MR Shoulder^ROUTINE
4 of 5 series · 20 of 40 positions shown · non-contrast
Comparison: none

[Series 3: T2 fat-sat · axial · 4.0mm · 0.35mm/px · z∈[-38,+67]mm · 8 of 23 slices shown (1 of 2)]
[im 1/23]
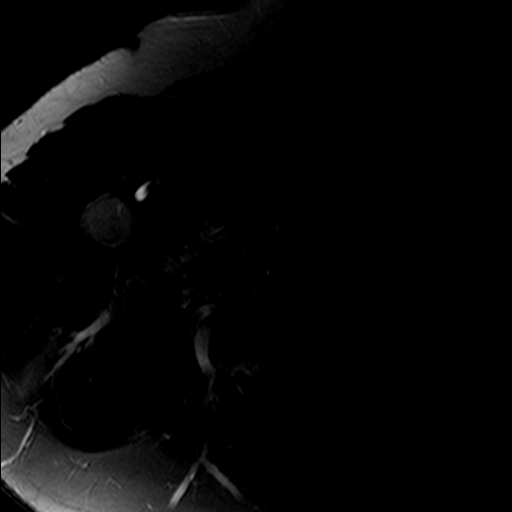
[im 4/23]
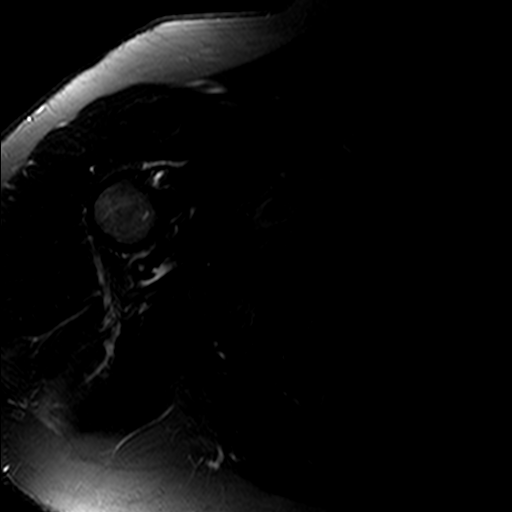
[im 7/23]
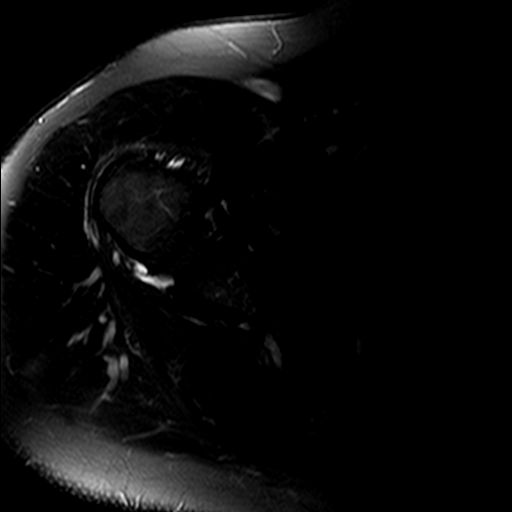
[im 10/23]
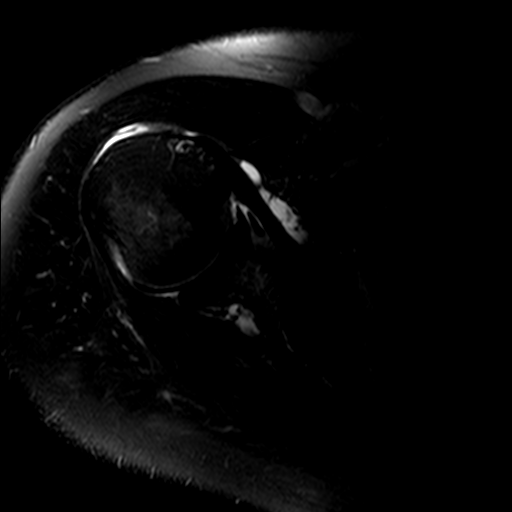
[im 13/23]
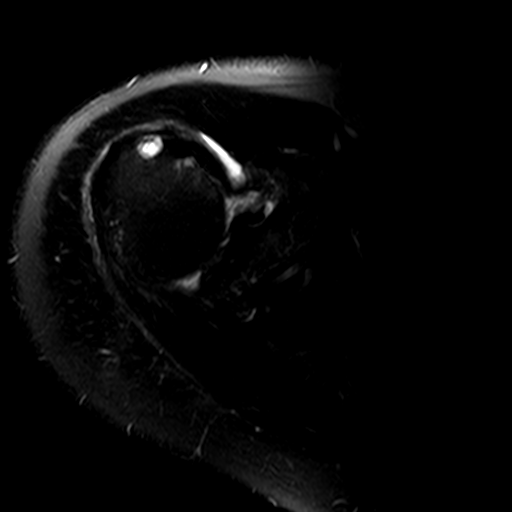
[im 16/23]
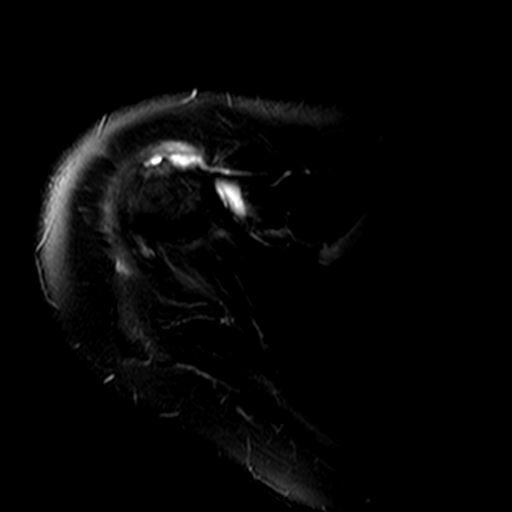
[im 19/23]
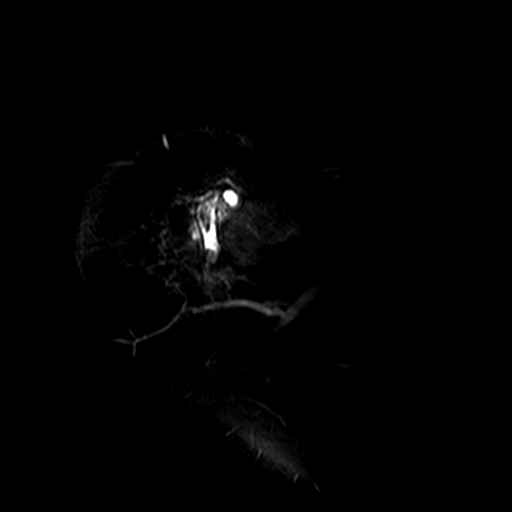
[im 23/23]
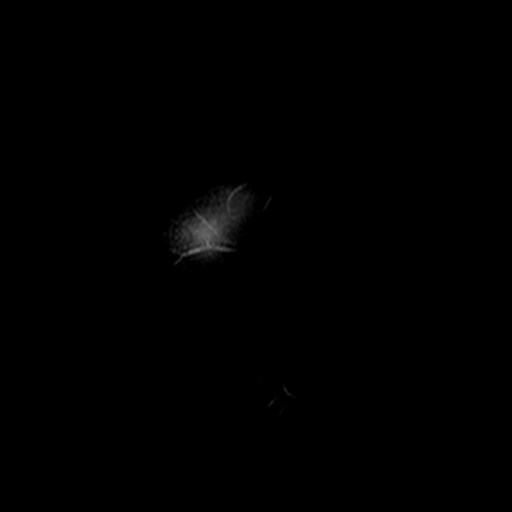

[Series 4: T2 fat-sat · oblique · 4.0mm · 0.27mm/px · 6 of 18 slices shown (2 of 2)]
[im 1/18]
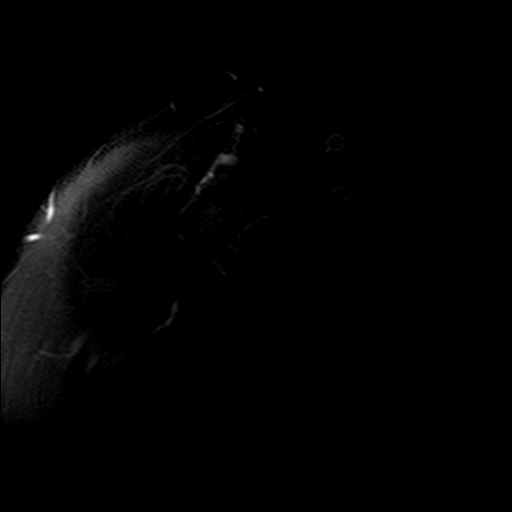
[im 3/18]
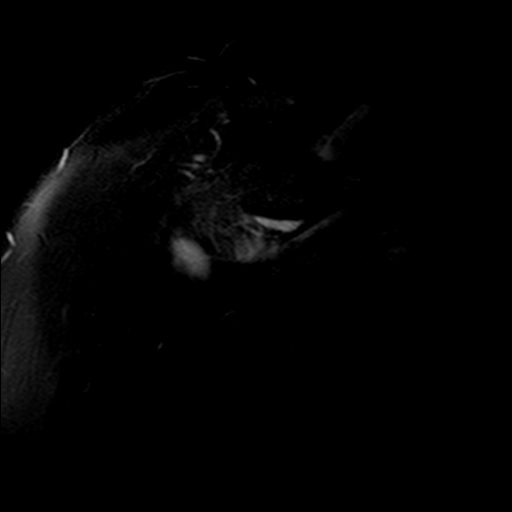
[im 6/18]
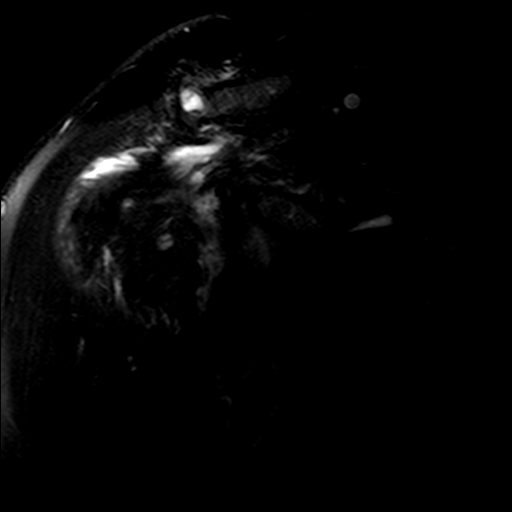
[im 9/18]
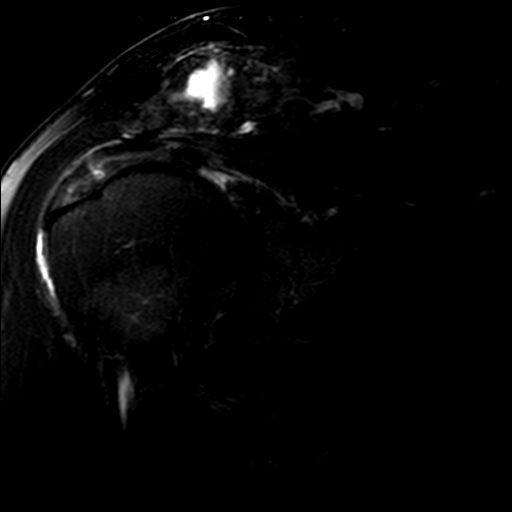
[im 12/18]
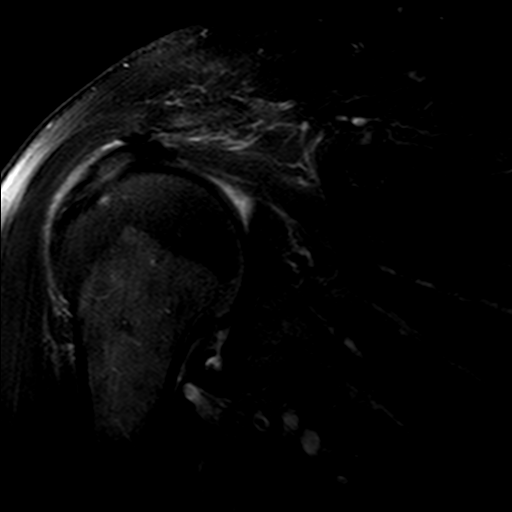
[im 15/18]
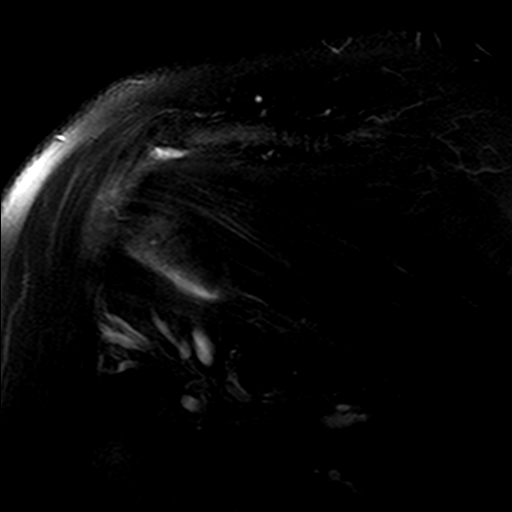

[Series 5: T1 · oblique · 4.0mm · 0.44mm/px · 3 of 22 slices shown]
[im 3/22]
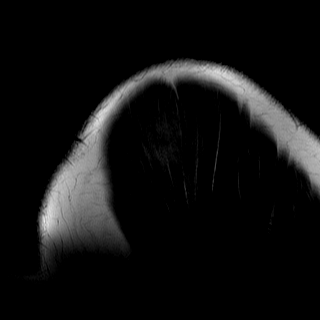
[im 11/22]
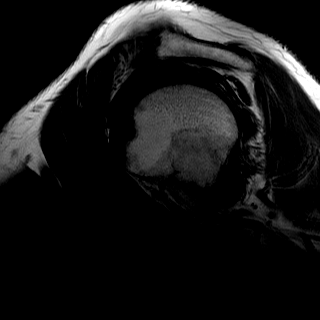
[im 19/22]
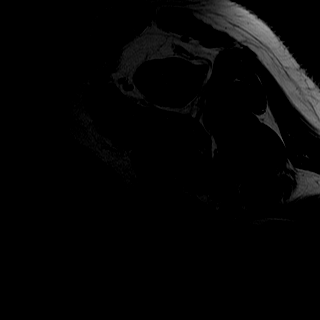

[Series 6: STIR · sagittal · 4.0mm · 0.27mm/px · 3 of 22 slices shown]
[im 3/22]
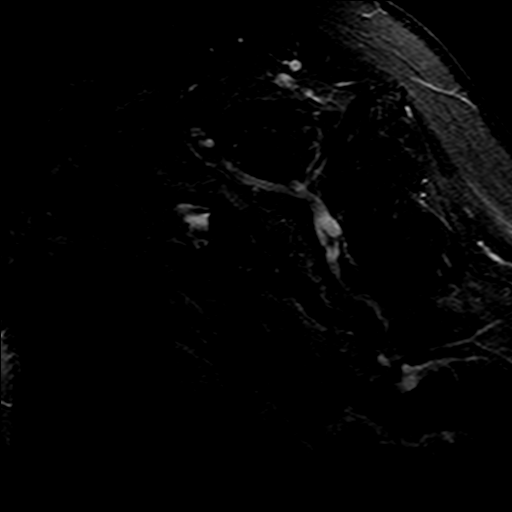
[im 11/22]
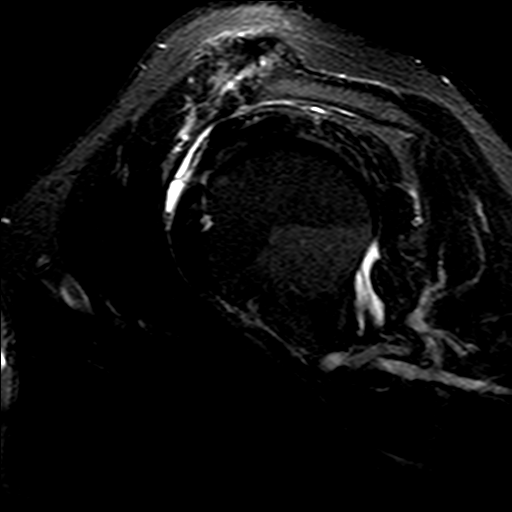
[im 19/22]
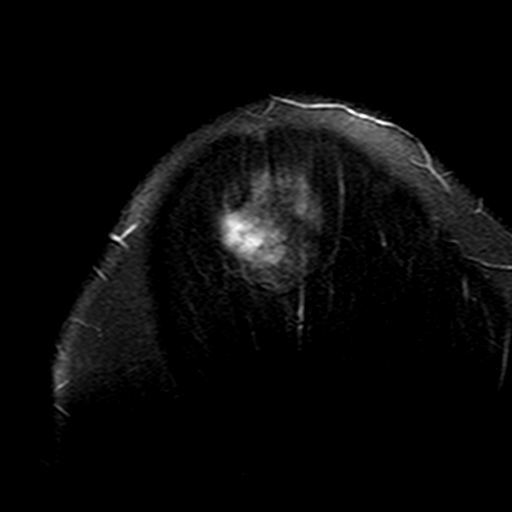

[20 of 40 positions shown; findings below may reference images not displayed]

EXAM

MR shoulder RT wo con

INDICATION

Right shoulder pain

TECHNIQUE

MR of the right shoulder was performed without intravenous contrast. Multiplanar, multisequence
imaging was performed.

COMPARISONS

XR right shoulder 07/23/20

FINDINGS

ROTATOR CUFF: Full-thickness fissure like tear measuring 4 x 9 millimeters at the anterior
supraspinatus tendon without propagation to the anterior leading edge, just proximal to the greater
tuberosity insertion (series 4, image 8). At least moderate to severe superior rotator cuff
tendinosis. Intact teres minor tendon.

BICEPS TENDON: Complete tear of the biceps tendon without visualization within the proximal
intertubercular groove (Series 3, image 16). Lack of visualization of the intra-articular segment

GLENOHUMERAL JOINT: Trace glenohumeral joint effusion. No labral tear.

ACROMIOCLAVICULAR JOINT/SPACE:  Severe acromioclavicular degenerative change with bony hypertrophy.
Type II acromion. Intact coracoclavicular and coracoacromial ligaments. At least moderate
subacromial/subdeltoid bursal distention with fluid. Minimal possible lateral acromial downsloping.

BONE: No acute fracture or aggressive focal osseous lesion. Subcortical cyst deep to the anterior
facet of the greater tuberosity.

MUSCLES: Normal muscle bulk of the rotator cuff.

NEUROVASCULAR: Normal.

IMPRESSION
1. Full-thickness fissure like tear of the supraspinatus as detailed above. Superimposed moderate
to severe superior rotator cuff tendinosis. No muscle atrophy of the rotator cuff.
2. Complete tear with distal retraction of the biceps tendon.
3. Trace glenohumeral joint effusion.
4. Severe acromioclavicular osteoarthrosis.

Tech Notes:

RT SHOULDER PAIN LIMITING ROM RG

## 2020-08-20 IMAGING — CT CALCIUM_SCORING(Adult)
2 series · 16 of 20 positions shown, 18 images · non-contrast
Comparison: none

[Series 2: calcium scoring 3.00 qr36 bestdiast 66% · axial · 0.37mm/px · z∈[+1618,+1739]mm · 8 of 100 slices shown, 10 images]
[im 12/100  vessel]
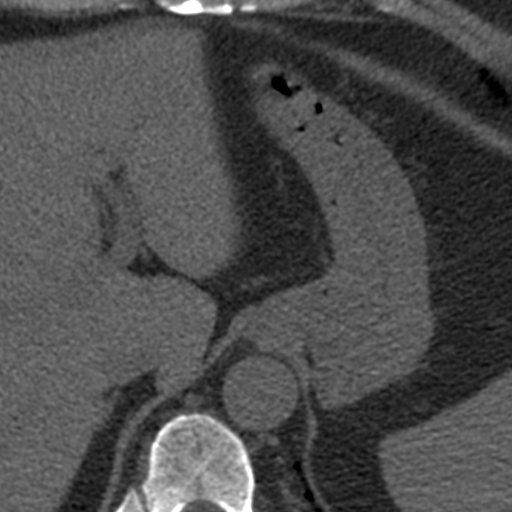
[im 12/100  lung]
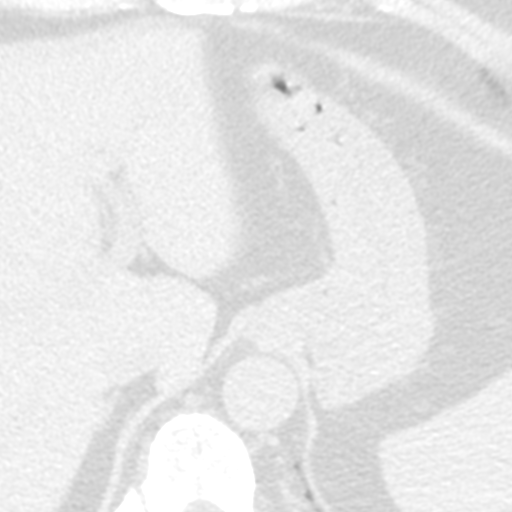
[im 23/100  vessel]
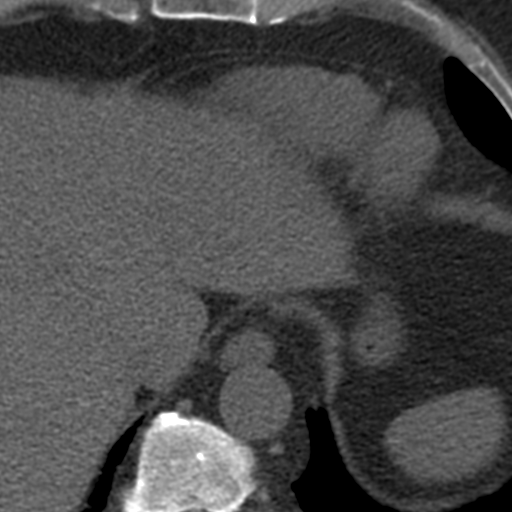
[im 34/100  vessel]
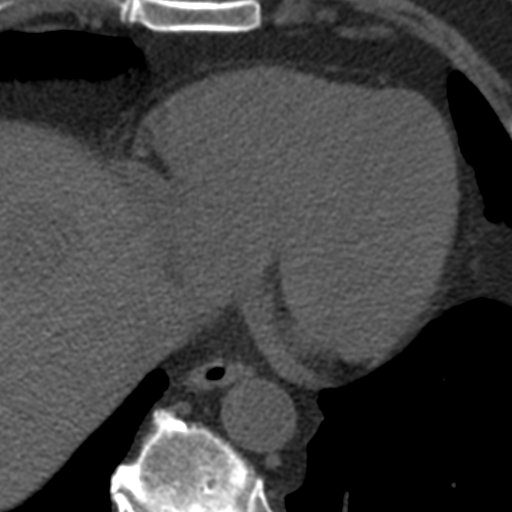
[im 45/100  vessel]
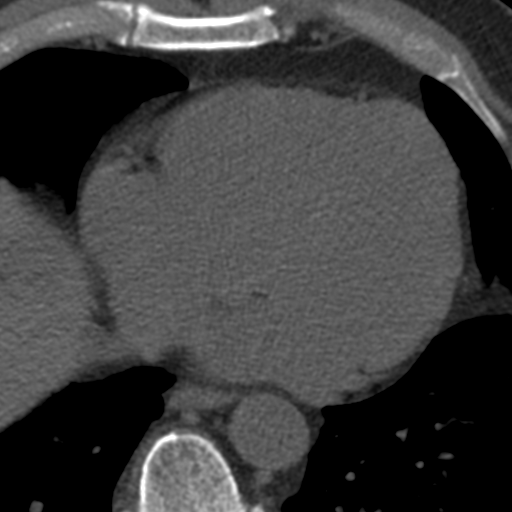
[im 56/100  vessel]
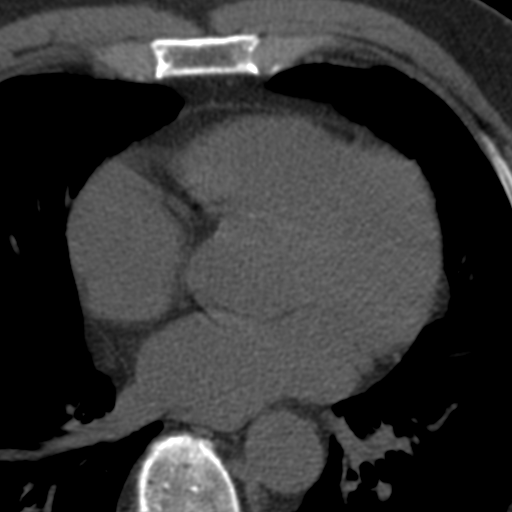
[im 56/100  lung]
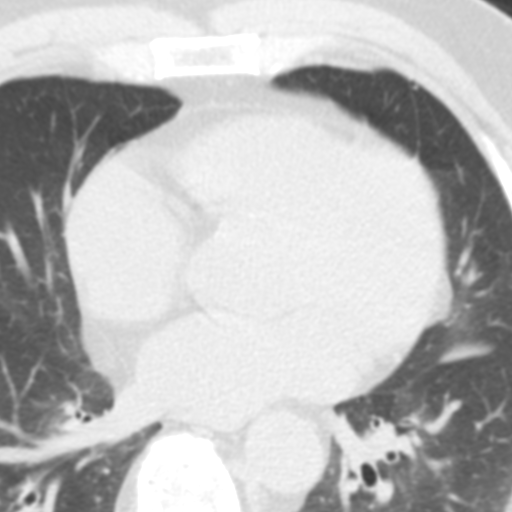
[im 67/100  vessel]
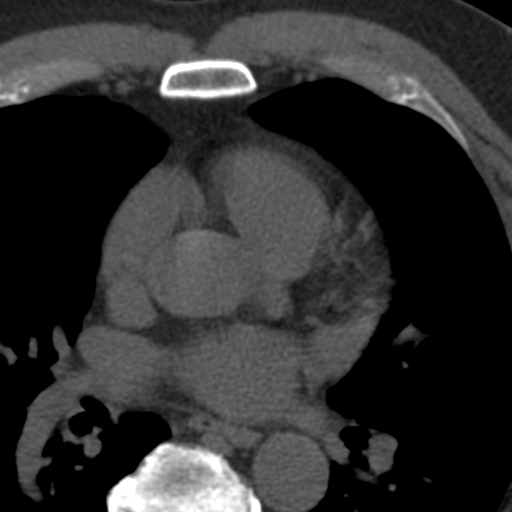
[im 78/100  vessel]
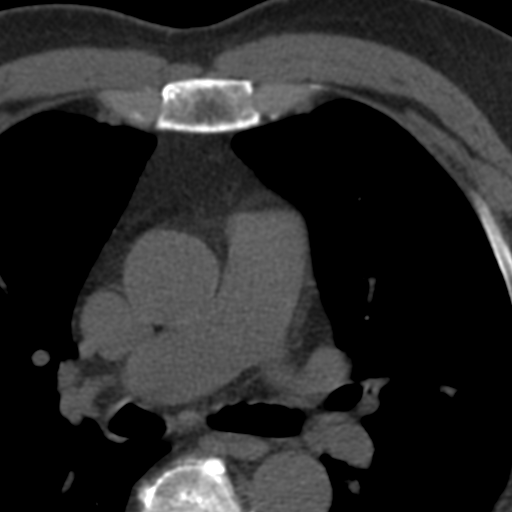
[im 89/100  vessel]
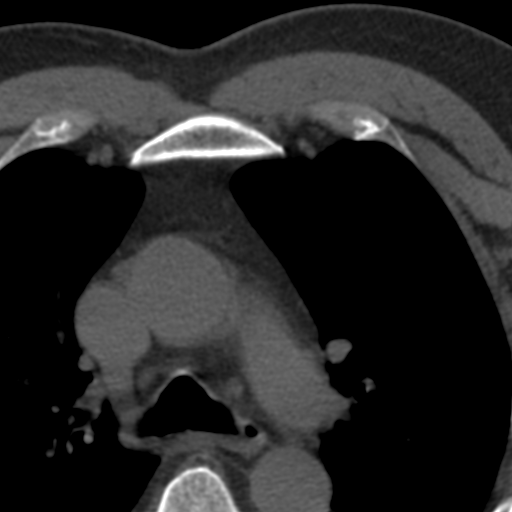

[Series 3: calcium scoring 1.50 br60 bestdiast 66% lung · axial · 0.37mm/px · z∈[+1618,+1740]mm · 8 of 104 slices shown]
[im 12/104  lung]
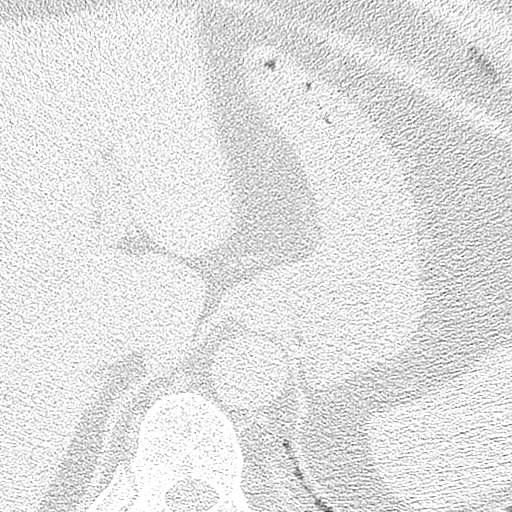
[im 23/104  lung]
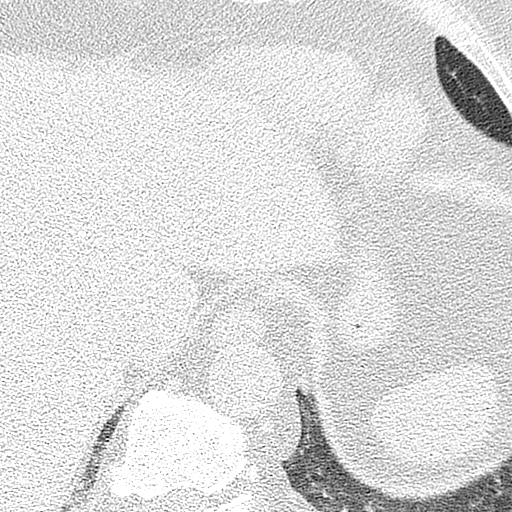
[im 35/104  lung]
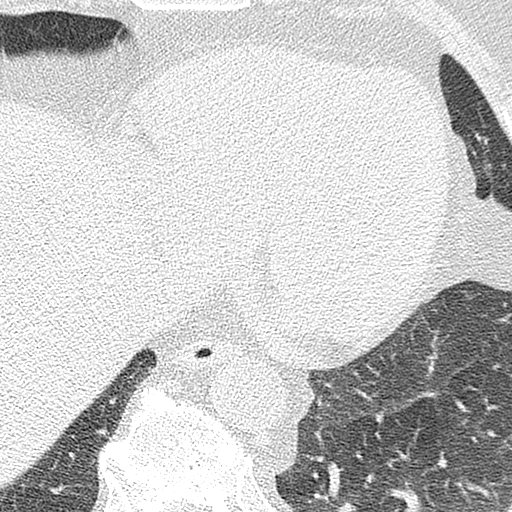
[im 46/104  lung]
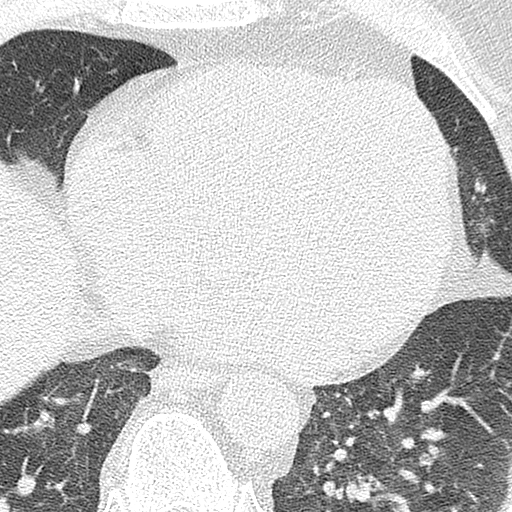
[im 58/104  lung]
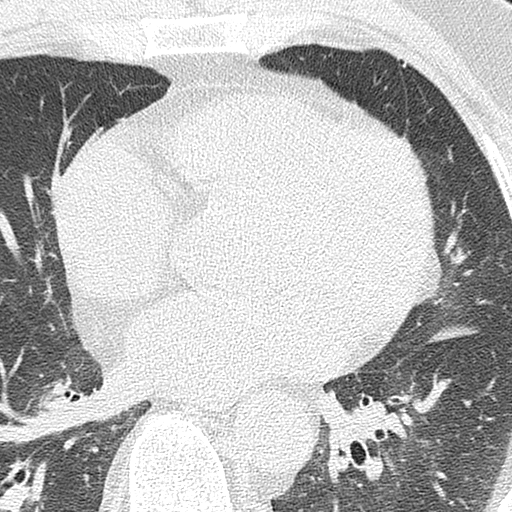
[im 69/104  lung]
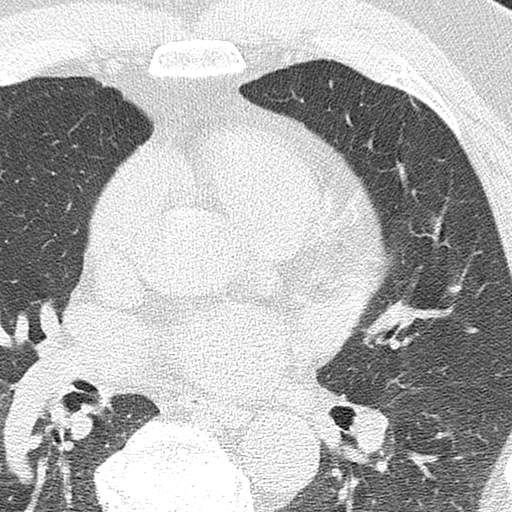
[im 81/104  lung]
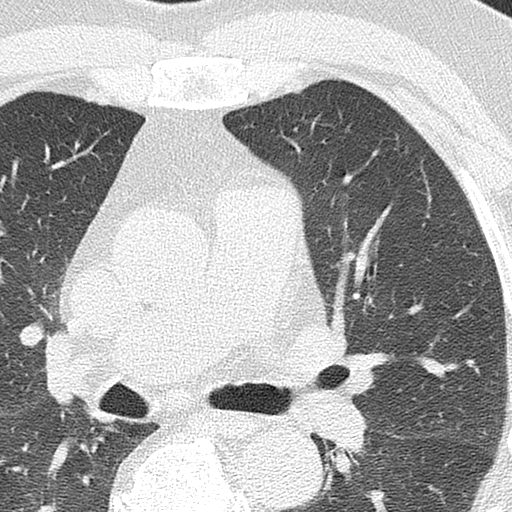
[im 92/104  lung]
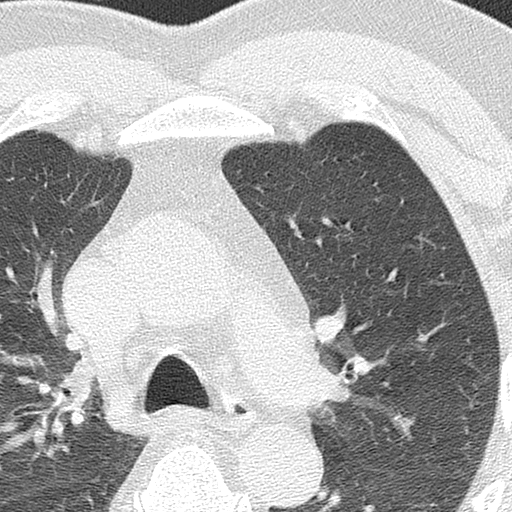

[16 of 20 positions shown; findings below may reference images not displayed]

EXAM

CT cardiac calcium scoring

INDICATION

Screening examination

TECHNIQUE

Gated calcification scoring has been obtained. All CT scans at this facility use dose modulation,
iterative reconstruction, and/or weight based dosing when appropriate to reduce radiation dose to as
low as reasonably achievable.

# of CT scans in the past year: 0 # of Myocardial perfusion scans this past year: 0

COMPARISONS

None available at the time of dictation.

FINDINGS

Total calcification score is calculated at

Left main artery MILTON 130 score: 0

Left anterior descending MILTON 130 score:

Left circumflex MILTON 130 score: 0

Right coronary artery MILTON 130 score: 0

These scores demonstrate mild plaque burden. Moderate cardiovascular disease risk.

The heart is normal size.

EXTRACARDIAC FINDINGS: Suspected hypoattenuating lesion measuring up to 3.5 cm at the hepatic dome,
incompletely characterized, similar to prior CT abdomen pelvis 09/25/2015 and imaging findings may
represent a hemangioma and please refer to prior description. If continued clinical concern,
recommend follow-up CT. (Series 2, image 64). Ground-glass attenuation in bilateral lower lobes with
minimal peribronchial thickening. Correlate for low lung volumes, atelectasis, versus less likely
infiltrate. Correlate for bronchitis.

IMPRESSION

1. Coronary calcification score calculated at 42.5.
2. Ground-glass attenuation in bilateral lower lobes with minimal peribronchial thickening.
Correlate for low lung volumes, atelectasis, versus less likely infiltrate. Correlate for
bronchitis.

Calcium score

0-0: No identifiable atherosclerotic plaque. Very low cardiovascular disease risk.

1-10: Minimal plaque burden. Low cardiovascular disease risk.

11-100: Mild plaque burden. Moderate cardiovascular disease risk.

101-400: Moderate plaque burden. High cardiovascular disease risk.

Greater than 401: Extensive plaque burden. Very High cardiovascular disease risk.

Tech Notes:

PT STATES SCREENING. HX OF DYSLIPIDEMIA. CT/NM 0/0. TJ

## 2020-08-28 ENCOUNTER — Encounter: Admit: 2020-08-28 | Discharge: 2020-08-28 | Payer: MEDICARE

## 2020-08-28 DIAGNOSIS — K219 Gastro-esophageal reflux disease without esophagitis: Secondary | ICD-10-CM

## 2020-08-28 DIAGNOSIS — N189 Chronic kidney disease, unspecified: Secondary | ICD-10-CM

## 2020-08-28 DIAGNOSIS — R12 Heartburn: Secondary | ICD-10-CM

## 2020-08-28 DIAGNOSIS — N429 Disorder of prostate, unspecified: Secondary | ICD-10-CM

## 2020-08-28 DIAGNOSIS — IMO0002 Ulcer: Secondary | ICD-10-CM

## 2020-08-28 DIAGNOSIS — Z0389 Encounter for observation for other suspected diseases and conditions ruled out: Secondary | ICD-10-CM

## 2020-08-28 DIAGNOSIS — E785 Hyperlipidemia, unspecified: Secondary | ICD-10-CM

## 2020-08-28 DIAGNOSIS — I00 Rheumatic fever without heart involvement: Secondary | ICD-10-CM

## 2020-08-28 DIAGNOSIS — R0600 Dyspnea, unspecified: Secondary | ICD-10-CM

## 2020-09-03 IMAGING — CR KNEELMLT
2 series · 2 of 2 positions shown · non-contrast
Comparison: none

[knee lat]
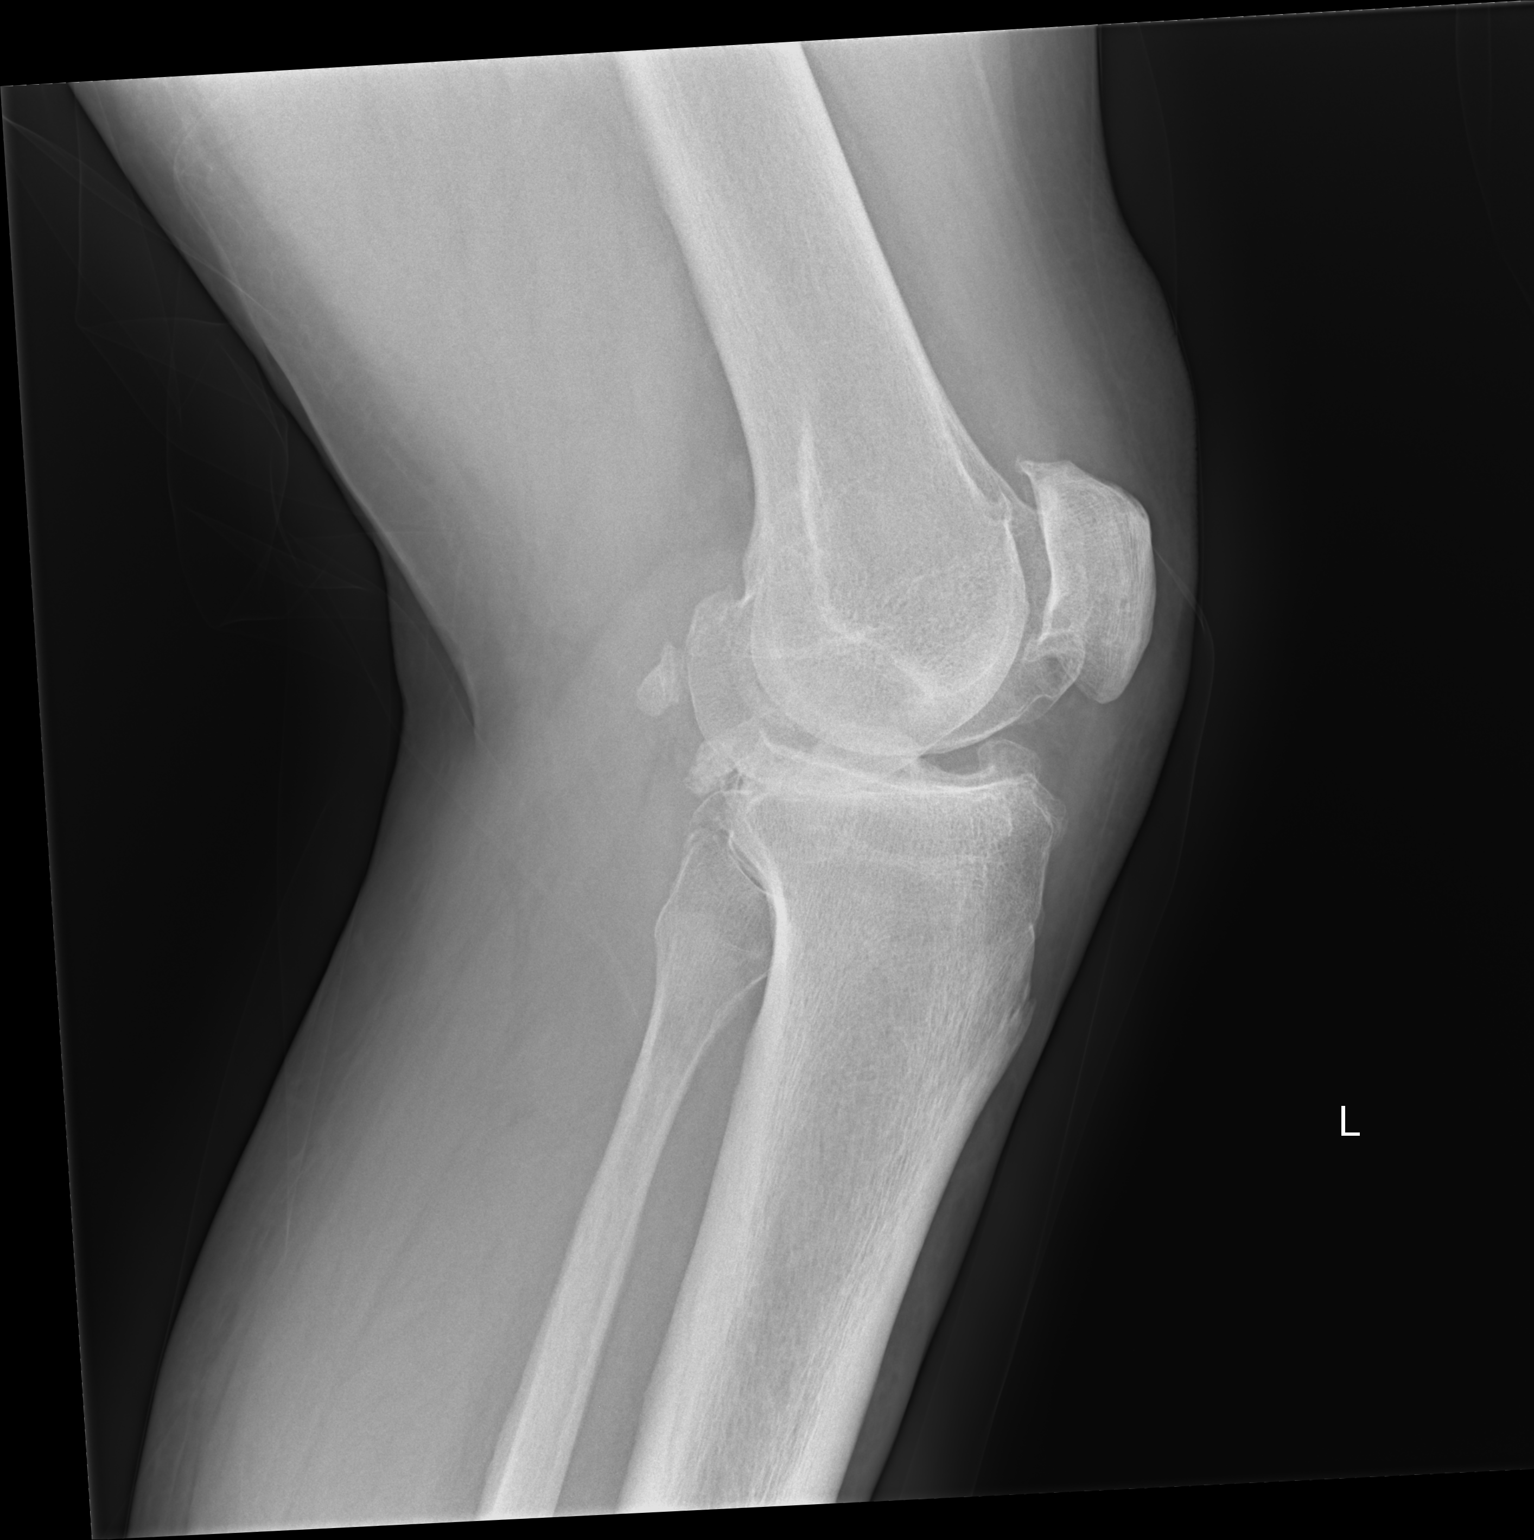

[knee sunrise]
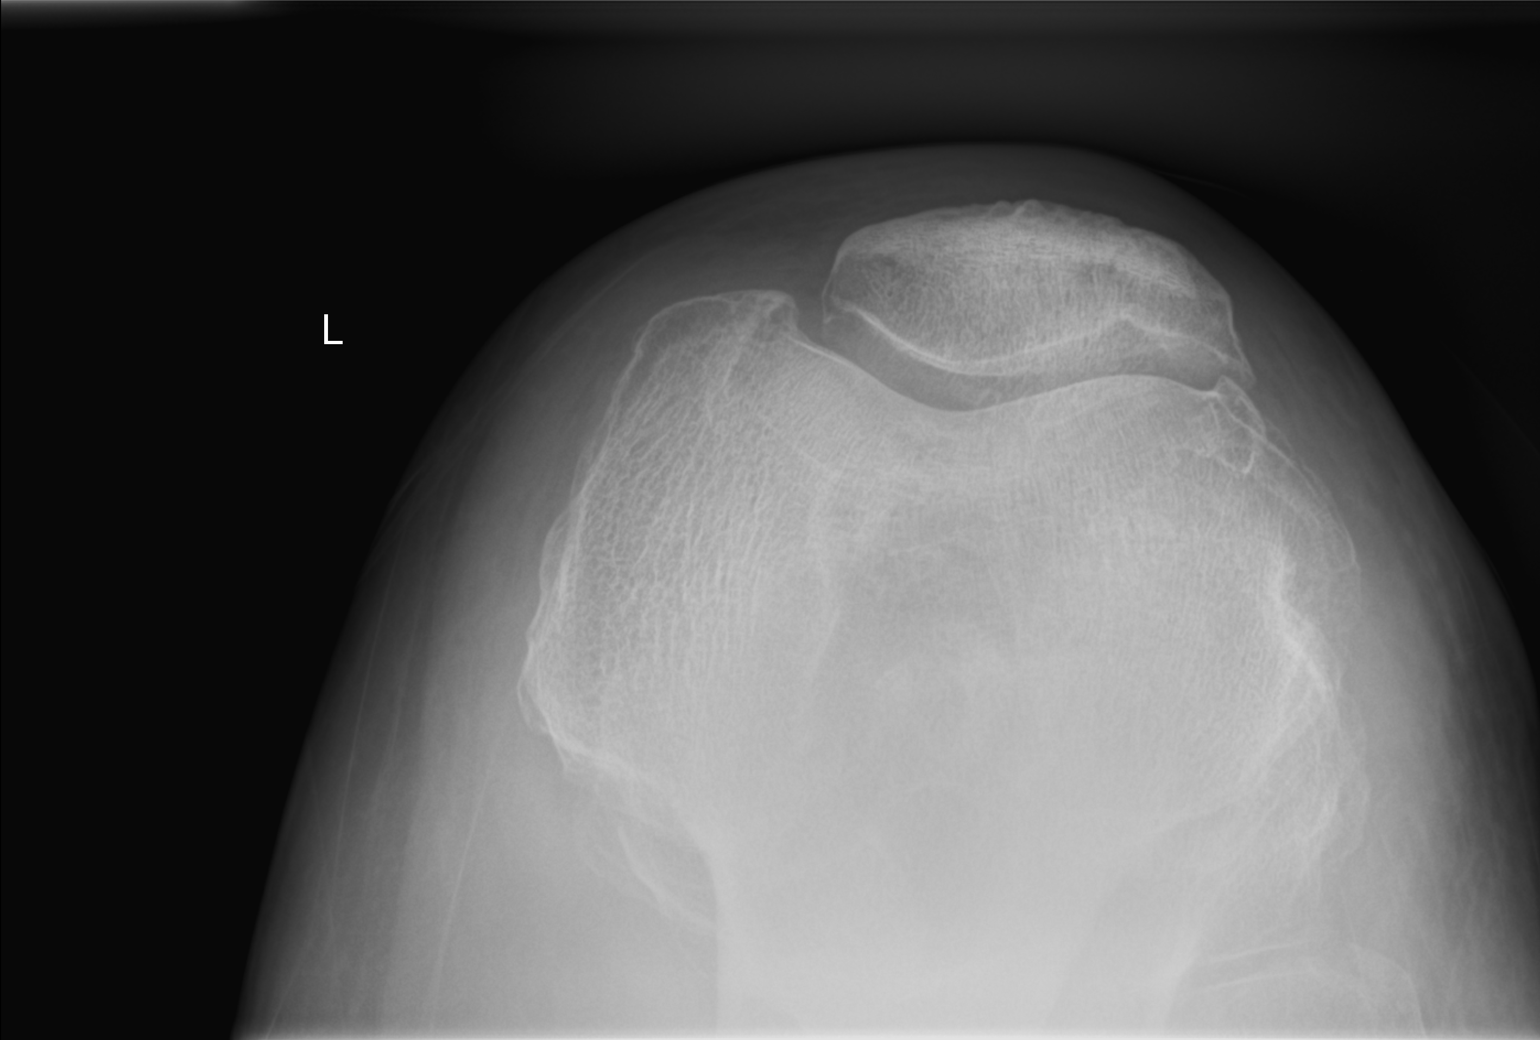

[2 of 2 positions shown; findings below may reference images not displayed]

DIAGNOSTIC STUDIES

EXAM

XR knee RT 2V

INDICATION

knee pain
bilat knee pain rt>lt.

TECHNIQUE

AP lateral and patellar views of both knees were obtained.

COMPARISONS

None available

FINDINGS

Right knee:
Moderate to marked lateral compartment narrowing is noted with associated osteophytic spurring.
Minimal medial compartment narrowing is seen. There is prominent spurring of the patellofemoral
joint space with small knee effusion. No fractures are seen.

Left knee: Moderate marked lateral compartment narrowing is noted with osteophytic spurring. There
is moderate narrowing of the medial compartment with spurring of the medial tibial plateau and
tibial spines. Prominent spurring of the patellofemoral joint space is evident with small knee
effusion

IMPRESSION

Tricompartment degenerative changes of both knees greatest in the lateral compartment. Small knee
effusions are noted. No fractures are seen.

Tech Notes:

bilat knee pain rt>lt.

## 2020-09-03 IMAGING — CR KNEESTBI
1 series · 1 of 1 positions shown · non-contrast
Comparison: none

[knee ap]
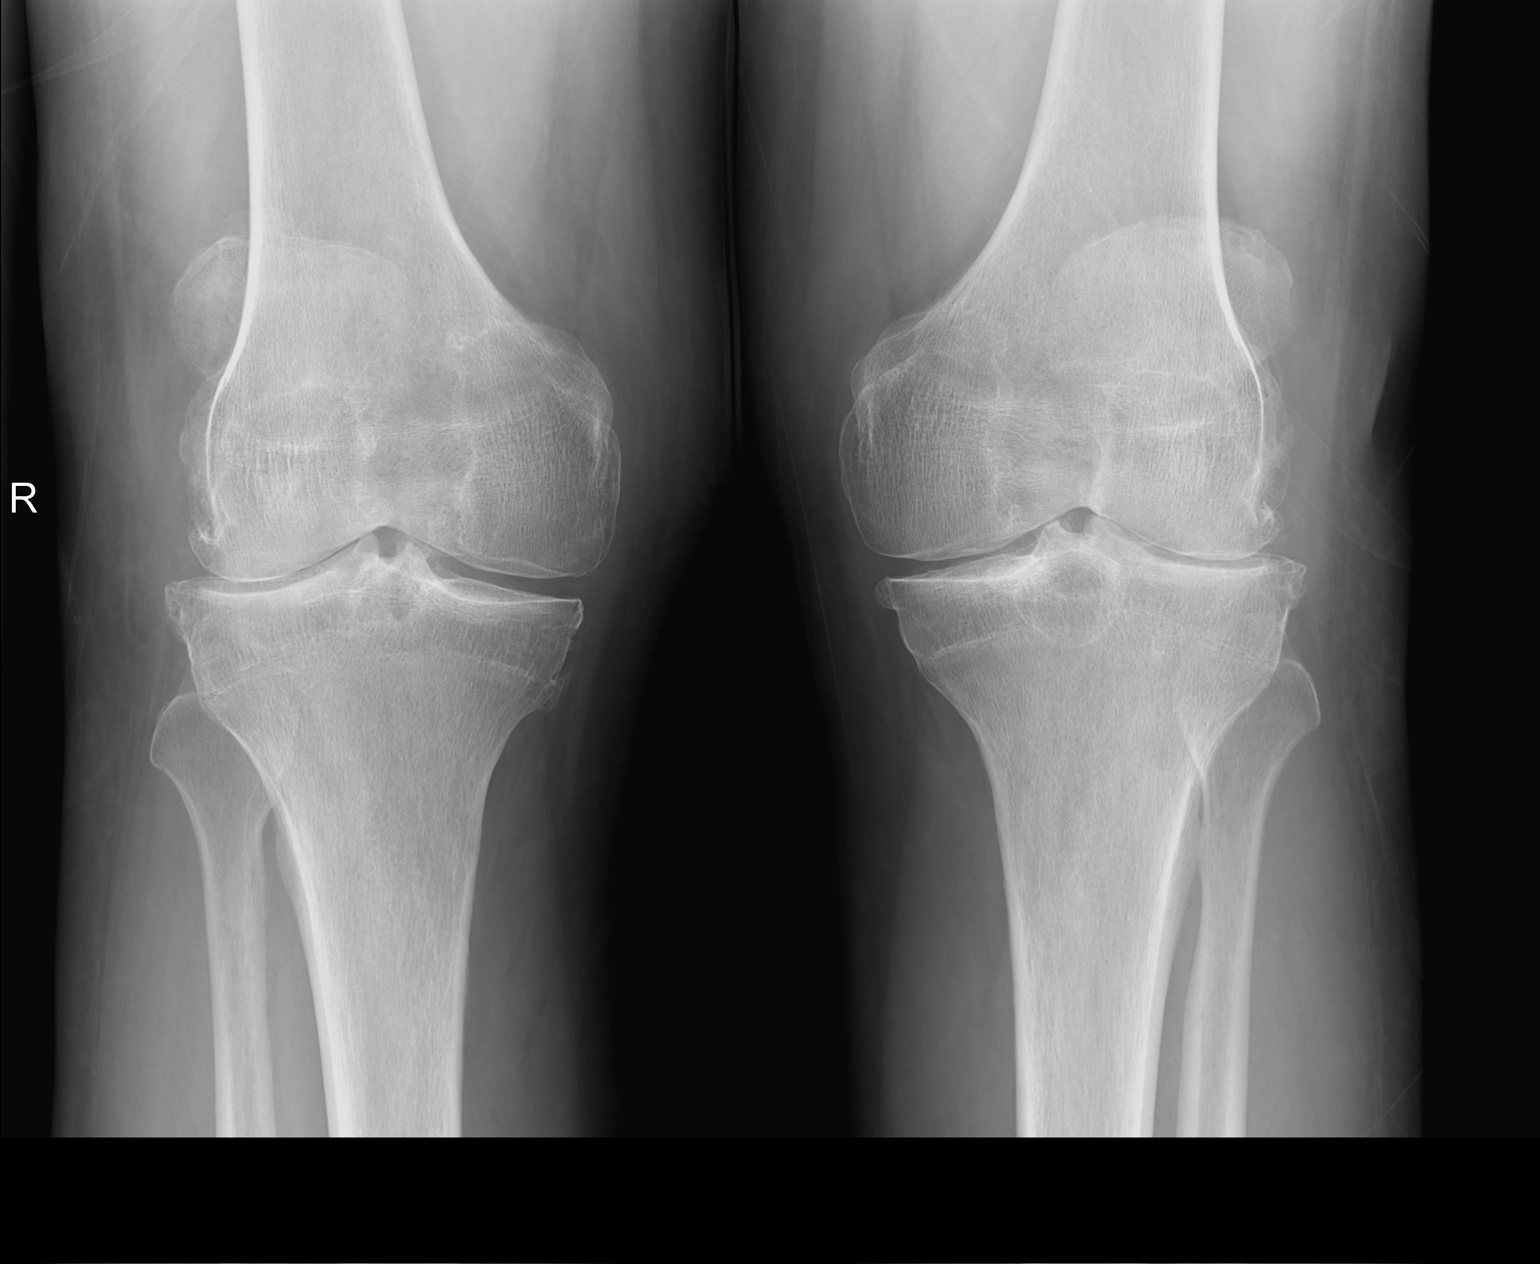

[1 of 1 positions shown; findings below may reference images not displayed]

DIAGNOSTIC STUDIES

EXAM

XR knee RT 2V

INDICATION

knee pain
bilat knee pain rt>lt.

TECHNIQUE

AP lateral and patellar views of both knees were obtained.

COMPARISONS

None available

FINDINGS

Right knee:
Moderate to marked lateral compartment narrowing is noted with associated osteophytic spurring.
Minimal medial compartment narrowing is seen. There is prominent spurring of the patellofemoral
joint space with small knee effusion. No fractures are seen.

Left knee: Moderate marked lateral compartment narrowing is noted with osteophytic spurring. There
is moderate narrowing of the medial compartment with spurring of the medial tibial plateau and
tibial spines. Prominent spurring of the patellofemoral joint space is evident with small knee
effusion

IMPRESSION

Tricompartment degenerative changes of both knees greatest in the lateral compartment. Small knee
effusions are noted. No fractures are seen.

Tech Notes:

bilat knee pain rt>lt.

## 2020-09-03 IMAGING — CR KNEELMRT
2 series · 2 of 2 positions shown · non-contrast
Comparison: none

[knee lat]
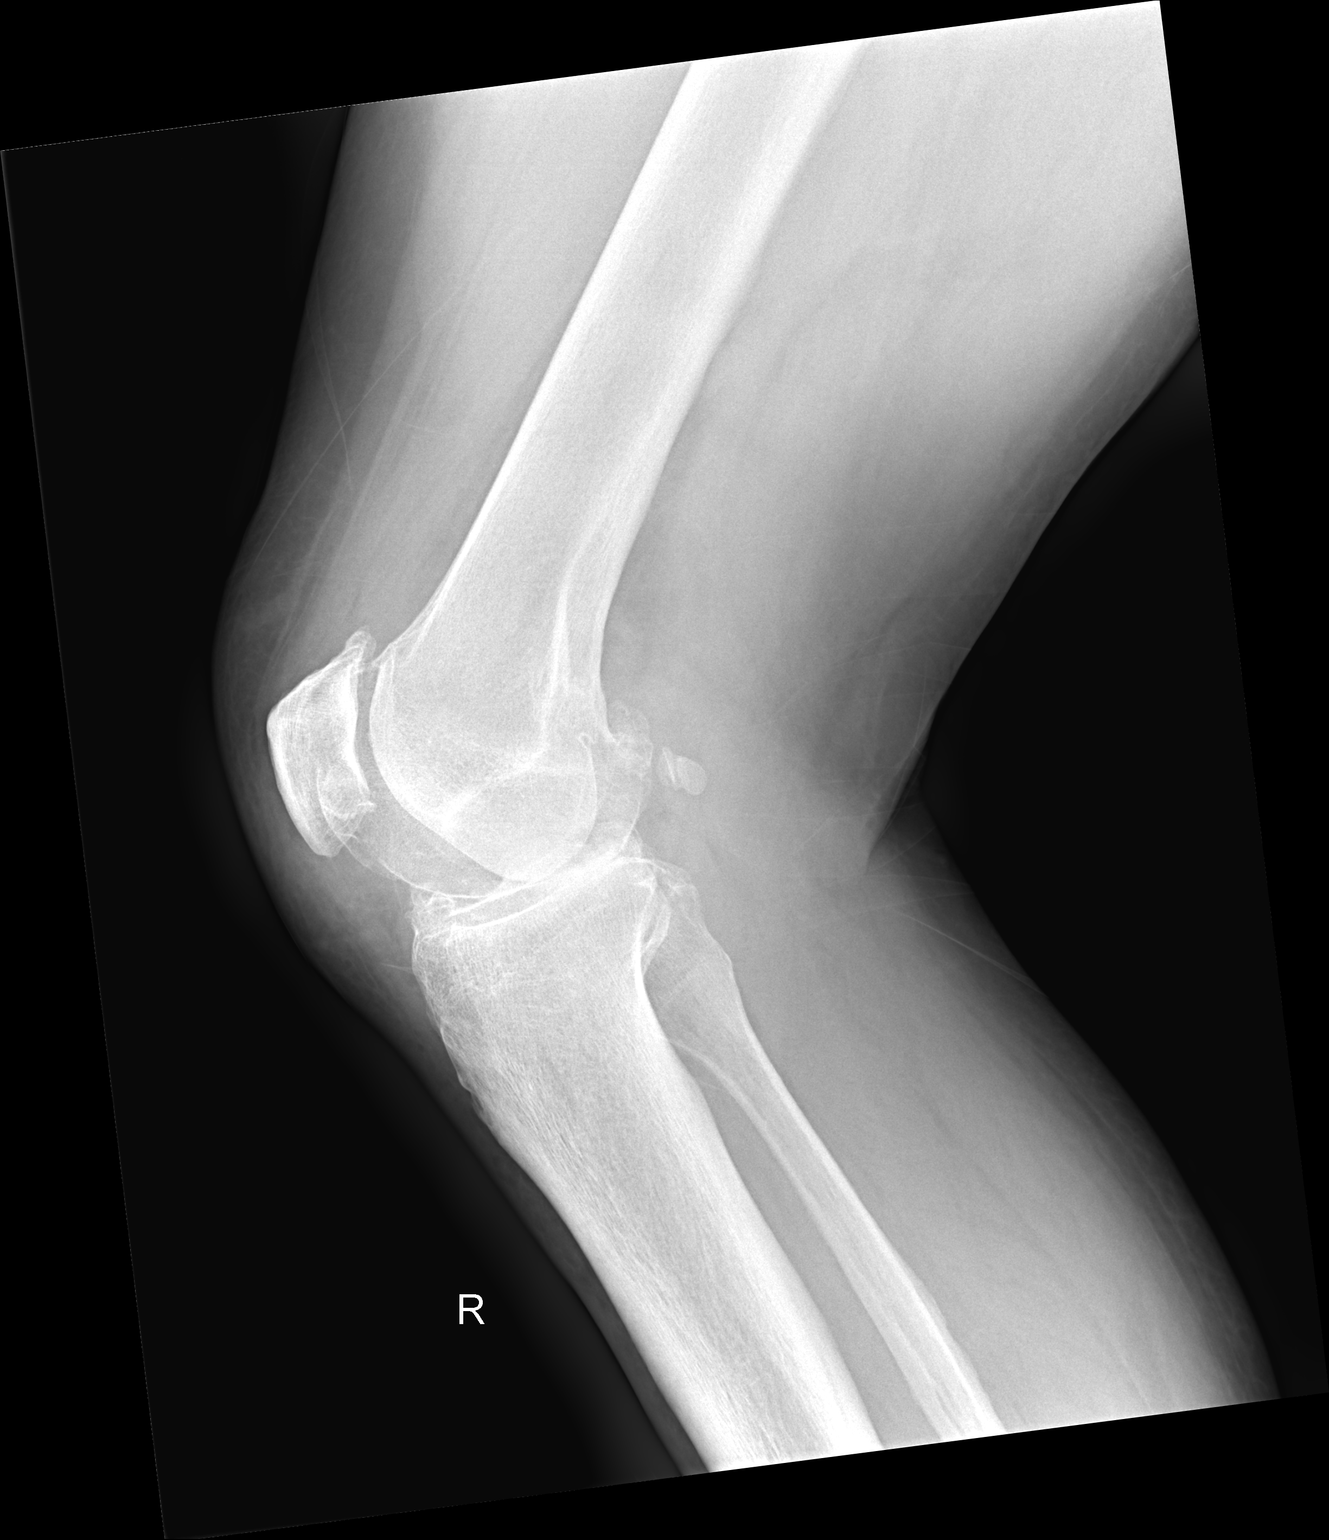

[knee sunrise]
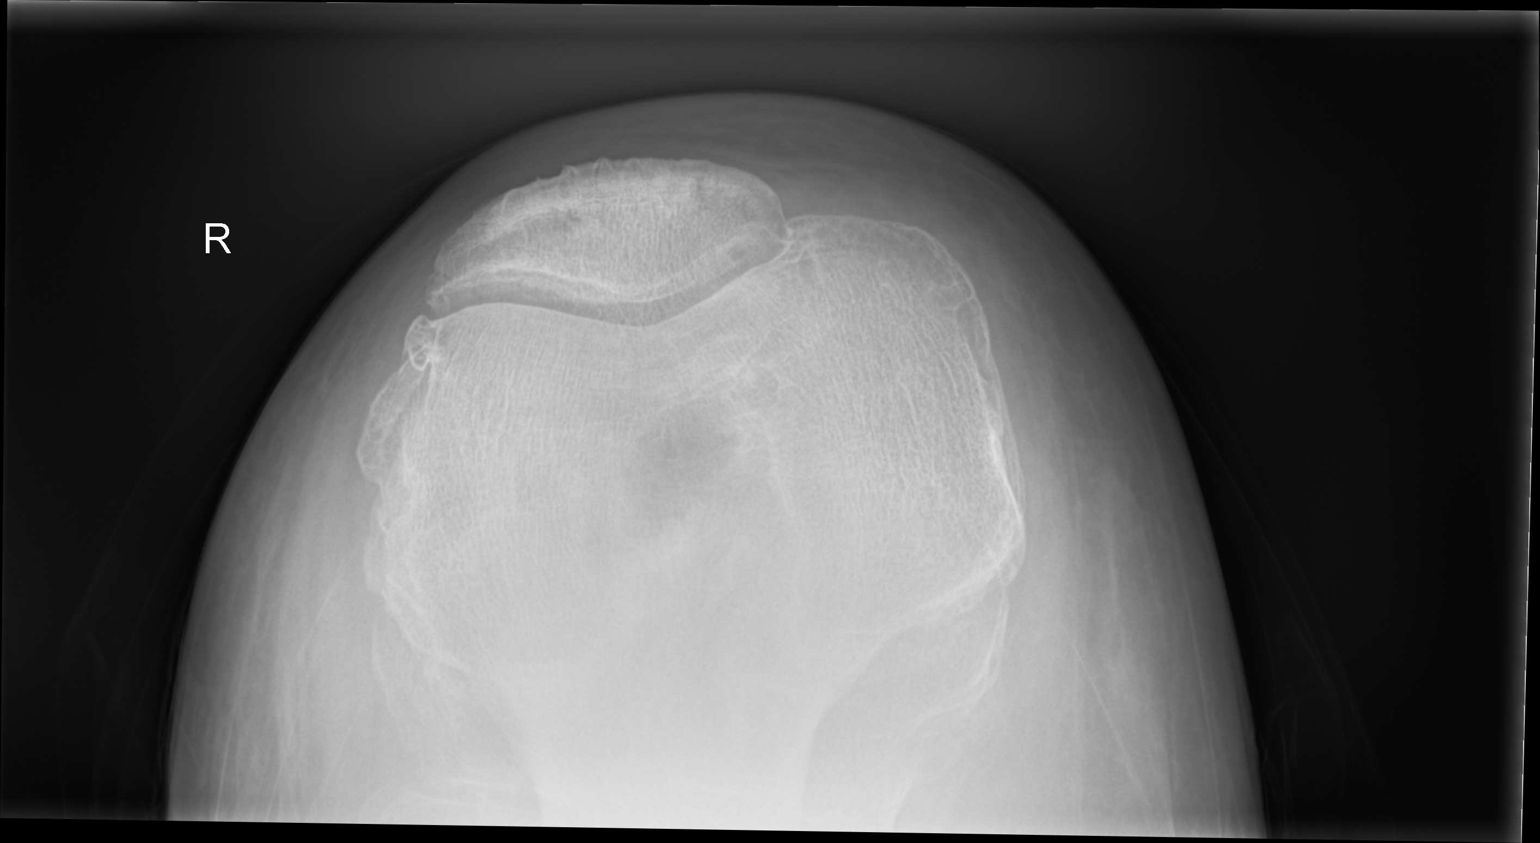

[2 of 2 positions shown; findings below may reference images not displayed]

DIAGNOSTIC STUDIES

EXAM

XR knee RT 2V

INDICATION

knee pain
bilat knee pain rt>lt.

TECHNIQUE

AP lateral and patellar views of both knees were obtained.

COMPARISONS

None available

FINDINGS

Right knee:
Moderate to marked lateral compartment narrowing is noted with associated osteophytic spurring.
Minimal medial compartment narrowing is seen. There is prominent spurring of the patellofemoral
joint space with small knee effusion. No fractures are seen.

Left knee: Moderate marked lateral compartment narrowing is noted with osteophytic spurring. There
is moderate narrowing of the medial compartment with spurring of the medial tibial plateau and
tibial spines. Prominent spurring of the patellofemoral joint space is evident with small knee
effusion

IMPRESSION

Tricompartment degenerative changes of both knees greatest in the lateral compartment. Small knee
effusions are noted. No fractures are seen.

Tech Notes:

bilat knee pain rt>lt.

## 2020-09-08 ENCOUNTER — Encounter: Admit: 2020-09-08 | Discharge: 2020-09-08 | Payer: MEDICARE

## 2020-09-08 ENCOUNTER — Ambulatory Visit: Admit: 2020-09-08 | Discharge: 2020-09-08 | Payer: MEDICARE

## 2020-09-08 DIAGNOSIS — IMO0002 Ulcer: Secondary | ICD-10-CM

## 2020-09-08 DIAGNOSIS — I00 Rheumatic fever without heart involvement: Secondary | ICD-10-CM

## 2020-09-08 DIAGNOSIS — E785 Hyperlipidemia, unspecified: Secondary | ICD-10-CM

## 2020-09-08 DIAGNOSIS — R531 Weakness: Secondary | ICD-10-CM

## 2020-09-08 DIAGNOSIS — R253 Fasciculation: Secondary | ICD-10-CM

## 2020-09-08 DIAGNOSIS — N189 Chronic kidney disease, unspecified: Secondary | ICD-10-CM

## 2020-09-08 DIAGNOSIS — Z0389 Encounter for observation for other suspected diseases and conditions ruled out: Secondary | ICD-10-CM

## 2020-09-08 DIAGNOSIS — R0600 Dyspnea, unspecified: Secondary | ICD-10-CM

## 2020-09-08 DIAGNOSIS — N429 Disorder of prostate, unspecified: Secondary | ICD-10-CM

## 2020-09-08 DIAGNOSIS — Z820 Family history of epilepsy and other diseases of the nervous system: Secondary | ICD-10-CM

## 2020-09-08 DIAGNOSIS — K219 Gastro-esophageal reflux disease without esophagitis: Secondary | ICD-10-CM

## 2020-09-08 DIAGNOSIS — W19XXXA Unspecified fall, initial encounter: Secondary | ICD-10-CM

## 2020-09-08 DIAGNOSIS — R12 Heartburn: Secondary | ICD-10-CM

## 2020-09-08 LAB — FOLATE, SERUM: SERUM FOLATE: 10 ng/mL (ref >3.9)

## 2020-09-08 LAB — VITAMIN B12: VITAMIN B12: 284 pg/mL (ref 180–914)

## 2020-09-08 LAB — CREATINE KINASE-CPK: CK TOTAL: 175 U/L (ref 35–232)

## 2020-09-08 NOTE — Progress Notes
Discussed [facility] lab testing through the [facility] with the patient. Completed [employer] paper requisition form for [id number]+Add 9 genes for ALS & FTD. 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 The [facility] saliva collection kit was collected. The patient did not have anything to eat or drink for 30 minutes prior to the collection. Patient's name and DOB labeled on tubing and confirmed with patient. Placed in biohazard bag provided and set up shipment with [employer] packaging provided. Date collected 09/08/2020, [employer] tracking # [id number] and pick up 09/08/2020 at 1630. Patient was provided the patient information card located in the collection kit with the confirmation code. All questions answered. Results turn around time will be ~14-21 days.     This RN ordered testing on [facility] website: "Order confirmed  Your order ID is [id number].    What's next?  Send a specimen kit to [employer] order status  Download results when ready"

## 2020-09-08 NOTE — Progress Notes
Subjective:       History of Present Illness  Joe Whitehead is a 70 y.o. male.     Initial Visit Date: 07.05.2022  Reason for Visit: gait issues  PCP: Dr. Herschell Dimes ([facility])    Paternal aunt:  ALS (age 44-55)  Father: COPD - Later was told he also had ALS clinically, as he was unable to wean off trach (age 22)  Brother: MS, wheelchair bound mostly now other than transfers (former Public house manager of [employer] school) (age 24)    He did have left arm EMG/NCS recently. He has h/o ulnar nerve issues. He says motor function was normal. Sensory was delayed. He says that his left hand goes to sleep when he sleeps with his arm above his head.   He says that he has been falling more. He had a trip where he had 3 falls. Getting in/out of the boat was difficult. He felt he struggled lifting his feet up over the edge. He feels that he gets clumsy and tripped up.  He did have an issue with sciatica on his last trip as well.    He has DJD of knees. He is going to have them replaced this fall, hopefully.   He does feel fasciculations now. He had them in LE in past, but these improved. He has them now in the bicep area. He says that this is at rest. No tongue fasciculations.   He has to put his hands down on something or put his hands up sometimes when he feels short of breath. He says that this is not frequent. He says it takes only one big breath to get back to normal. This has been going on for 5 years.   He denies neck pain, but he does have decreased range of motion.     He has torn biceps tendon in the past and had an injection on right (Thursday last week).     He is interested in genetic testing for ALS with family history. He does not feel that he needs/wants genetic counseling. He understands the implications of the testing, including for his family. He has put things in place as necessary to plan for his future no matter the outcome of the testing.     He reports h/o CT head in the past years ago. He had MRI lumbar spine >5 years ago.     Reviewed past notes from Dr. April McVey: no signs of ALS at that time.      Medical History:   Diagnosis Date   ? 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 DOE (dyspnea on exertion) 09/22/2016   ? GERD (gastroesophageal reflux disease) 09/22/2016   ? Heartburn    ? Hyperlipemia 09/22/2016   ? Observation for suspected cardiovascular disease 09/22/2016    03/19/2010  Exercise Echo:  NSR PVC's noted throughout study.  Normal LV sz and function.  Normal hyperdynamic response for all segments.  No Echocardiographic evidence of ischemia.     ? Prostate disorder    ? Rheumatic fever    ? Ulcer     Stomach Issues     Surgical History:   Procedure Laterality Date   ? SPINE SURGERY  1997    R L5-S1 microdiscectomy   ? Colonoscopy N/A 01/03/2019    Performed by Tempie Hoist, DO at [facility] ENDO   ? COLONOSCOPY WITH SNARE REMOVAL TUMOR/ POLYP/ OTHER LESION  01/03/2019  Performed by Tempie Hoist, DO at [facility] ENDO   ? COLONOSCOPY WITH BIOPSY - FLEXIBLE  01/03/2019    Performed by Tempie Hoist, DO at [facility] ENDO   ? BLADDER SUSPENSION      Dr. Larwance Rote - urology   ? CLAVICLE SURGERY  13 and 17 years    X2 Left   ? ELBOW SURGERY  1974, 1989   ? KNEE ARTHROSCOPY      bilateral   ? NERVE BIOPSY     ? ULNAR TUNNEL RELEASE Right      Social History     Tobacco Use   ? Smoking status: Never Smoker   ? Smokeless tobacco: Never Used   Substance Use Topics   ? Alcohol use: Yes   ? Drug use: No     Family History   Problem Relation Age of Onset   ? COPD Mother    ? COPD Father    ? Multiple sclerosis Brother    ? Neurologic Disorder Paternal Aunt         ALS     Allergies   Allergen Reactions   ? Crestor [Rosuvastatin] MUSCLE PAIN       Review of Systems   Musculoskeletal: Positive for arthralgias, back pain and gait problem.             Objective:         ? acetaminophen SR (TYLENOL) 650 mg tablet Take 650 mg by mouth daily.   ? docusate (COLACE) 100 mg capsule Take 100 mg by mouth every 48 hours.   ? ERGOCALCIFEROL (VITAMIN D2) (VITAMIN D PO) Take 2,000 Units by mouth daily.   ? esomeprazole DR (NEXIUM) 20 mg capsule Take 20 mg by mouth as Needed (1-2x every 3 weeks). Take on an empty stomach at least 1 hour before or 2 hours after food.   ? eszopiclone (LUNESTA) 3 mg tablet Take 3 mg by mouth at bedtime as needed for Sleep.   ? glucosamine/chondr su A sod (OSTEO BI-FLEX PO) Take 2 tablets by mouth daily. Indications: per patient takes 2 tab per day   ? mirabegron (MYRBETRIQ) 50 mg ER tablet Take 50 mg by mouth daily.   ? tadalafiL (CIALIS) 20 mg tablet Take 10 mg by mouth as Needed for Erectile dysfunction.     Vitals:    09/08/20 0903   BP: (!) 145/89   BP Source: Arm, Left Upper   Pulse: 67   SpO2: 98%   PainSc: Zero   Weight: 111.1 kg (245 lb)   Height: 179.1 cm (5' 10.5)     Body mass index is 34.66 kg/m?Marland Kitchen   General: alert, oriented x 3   Speech: normal, no dysarthria  Cranial nerves:II Visual fields full to finger counting; III, IV, VI PERRL, extraocular muscles intact, no nystagmus; V facial sensation intact; VII facial expression symmetric; VIII hearing intact to conversation; IX, X palate rise symmetric; XI sternocleidomastoid strength intact; XII tongue midline, no fasciculations present  Motor: (Right/Left)  NE/NF 5/5, Deltoid 5/5, Biceps 5/5, Triceps 5/5, Finger ext 5/5, interossei 5/5, Hip Flexion 4/4, Knee ext 5/5, Knee flex 5/5, Ankle dorsiflexion 5/5, Ankle plantarflexion 5/5 - + fasciculations in right deltoid  Sensory: Normal to light touch. Proprioception intact at toes. Pinprick is normal in BLE overall, as well as BUE. Vibration in seconds R/L toes 6/6  Coordination: normal finger nose finger, rapid alternating movements and finger tapping speed normal, and heel to shin smooth without ataxia  Reflexes: (Right/Left) Biceps 1+/1+, Triceps 2/2, Brachioradialis 2/2, Knee Jerks 0/0, Ankle Jerks 0/0, Toes downgoing  Abnormal Movements: mild right arm resting tremor, no rigidity, no bradykinesia      ASSESSMENT/PLAN:     Hoyt Koch 70 y.o. male is here today for evaluation of fasciculations in right upper arm, but no weakness present. He does have bilateral hip flexor weakness in setting of prior back surgery. 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    1. Weakness    2. Fall, initial encounter    3. Muscle fasciculation    4. Family hx of ALS (amyotrophic lateral sclerosis)      RECOMMENDATIONS:  ? Check CPK, SPEP/IFE, B12, folate (labs)  ? MRI cervical and lumbar spine due to falls, weakness  ? EMG/NCS with MND screen, although discussed exam today showed Right deltoid fasciculations, no discernable weakness there, bilateral HF weakness, normal to hyporeflexia throughout, so exam at this time is not consistent with MND, there is concern with family history.   ? After discussion today, Dr. Andreas Newport would like to move forward with genetic testing.   ? Continue fall precautions.         FOLLOWUP PLAN  Return in about 4 weeks (around 10/06/2020) for In Person.  The patient is instructed to contact me if there are any concerns with the agreed plan.

## 2020-09-09 ENCOUNTER — Encounter: Admit: 2020-09-09 | Discharge: 2020-09-09 | Payer: MEDICARE

## 2020-09-09 DIAGNOSIS — D892 Hypergammaglobulinemia, unspecified: Secondary | ICD-10-CM

## 2020-09-09 DIAGNOSIS — R778 Other specified abnormalities of plasma proteins: Secondary | ICD-10-CM

## 2020-09-09 NOTE — Telephone Encounter
Please send B12 orders to Dr. Tobey Grim office. He will complete the injections there.     Please fax labs to his office as well.     Fax: [phone]

## 2020-09-09 NOTE — Telephone Encounter
Called Dr. Jaclyn Shaggy to discuss lab results. He will return call in 30 minutes.     B12 284, needs replacement. Will discuss options.     IFE: IgG kappa paraprotein  SPEP: spike, probably in monoclonal beta/gamma region      Orders: Immunoglobulins, urine IFE, Kappa/lambda free light chains, CBC w/diff, BUN, creatinine; hematology referral

## 2020-09-10 ENCOUNTER — Encounter: Admit: 2020-09-10 | Discharge: 2020-09-10 | Payer: MEDICARE

## 2020-09-10 IMAGING — MR L-spine^Routine
4 series · 36 of 48 positions shown · non-contrast
Comparison: none

[Series 2: T2 · sagittal · 4.0mm · 0.62mm/px · 8 of 13 slices shown (1 of 2)]
[im 1/13]
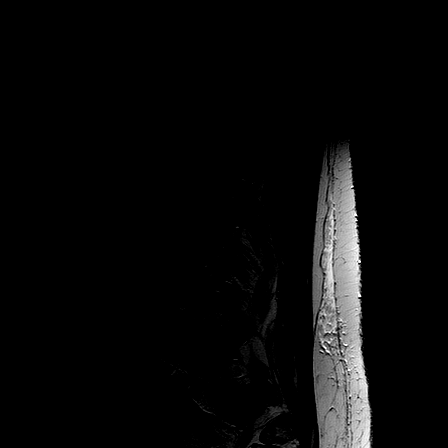
[im 2/13]
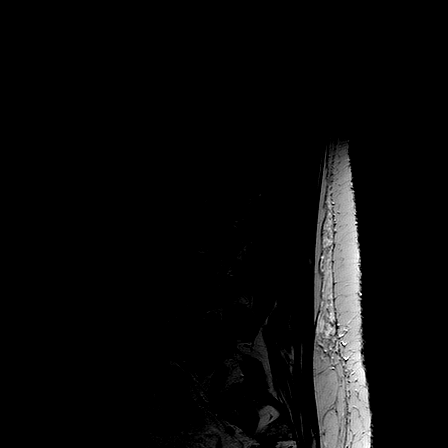
[im 4/13]
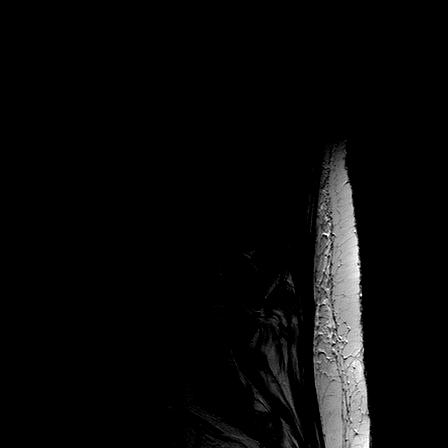
[im 6/13]
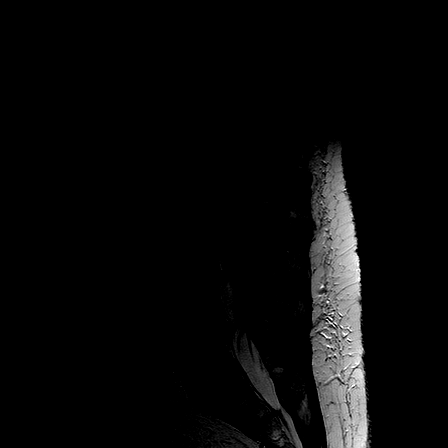
[im 7/13]
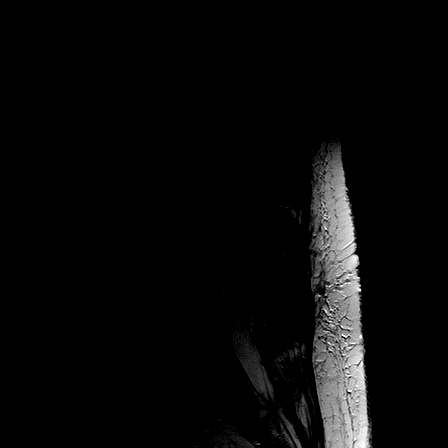
[im 9/13]
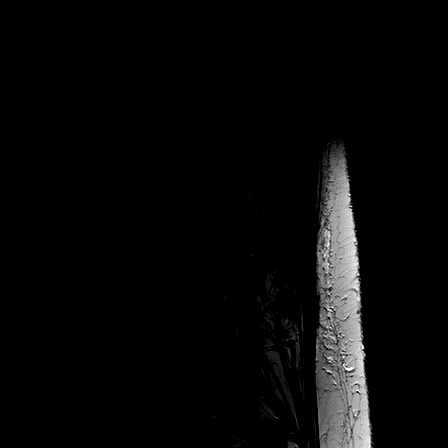
[im 11/13]
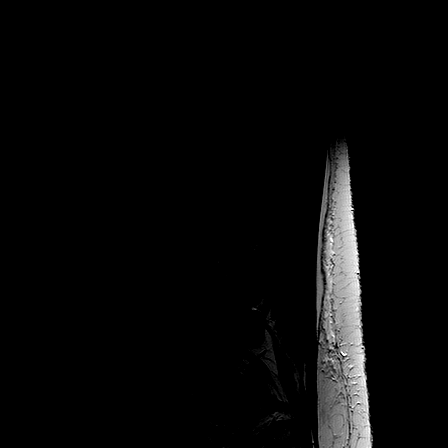
[im 13/13]
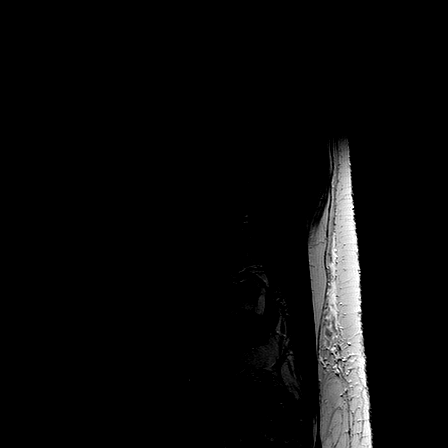

[Series 3: T1 · sagittal · 4.0mm · 0.73mm/px · 8 of 13 slices shown (1 of 2)]
[im 1/13]
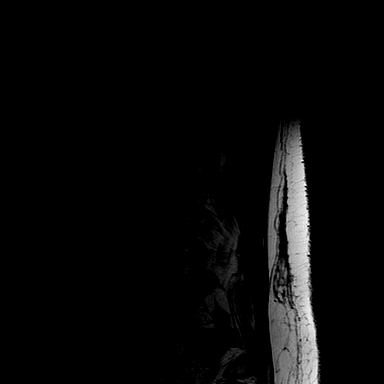
[im 2/13]
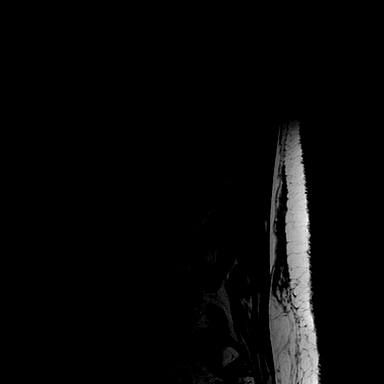
[im 4/13]
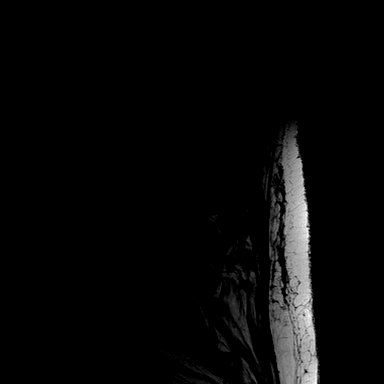
[im 6/13]
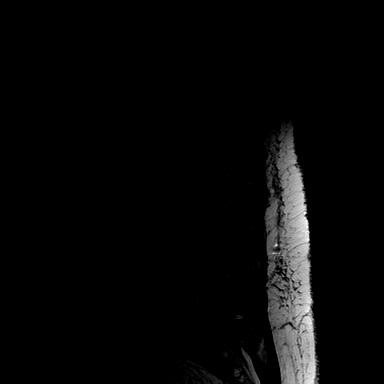
[im 7/13]
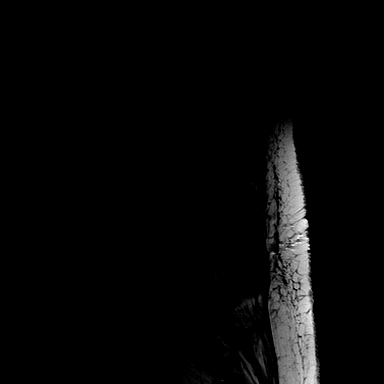
[im 9/13]
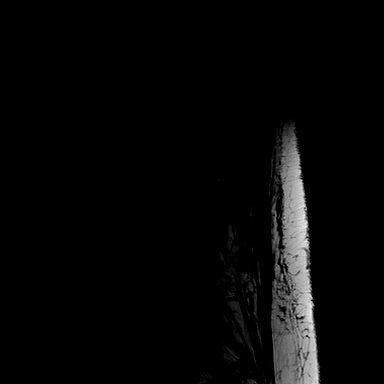
[im 11/13]
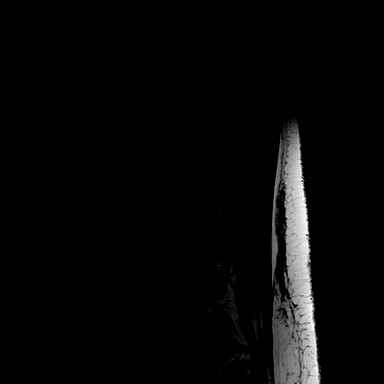
[im 13/13]
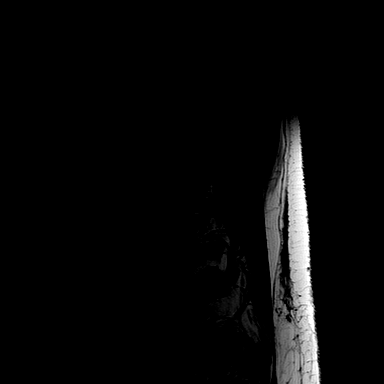

[Series 5: T2 · axial · 4.5mm · 0.49mm/px · z∈[-118,+54]mm · 11 of 29 slices shown (2 of 2)]
[im 2/29]
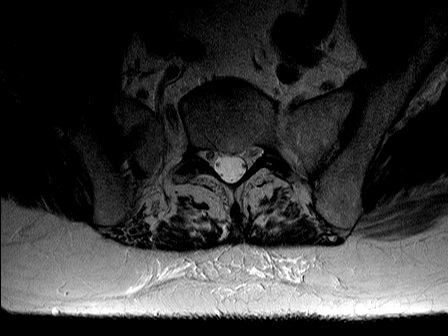
[im 4/29]
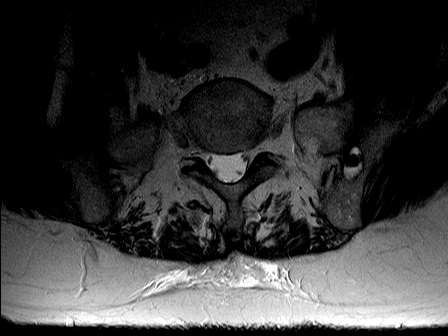
[im 6/29]
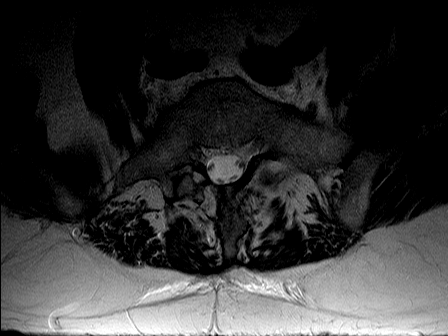
[im 9/29]
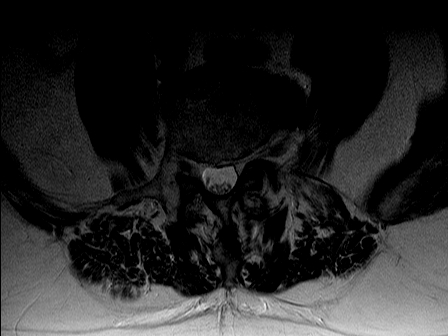
[im 13/29]
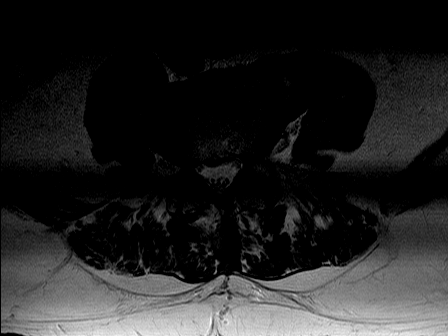
[im 15/29]
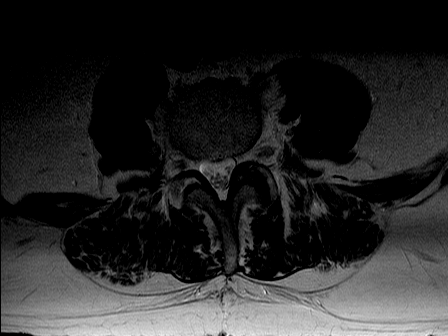
[im 16/29]
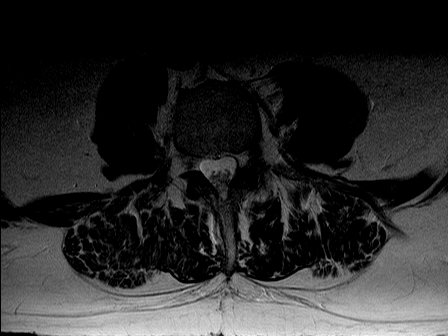
[im 20/29]
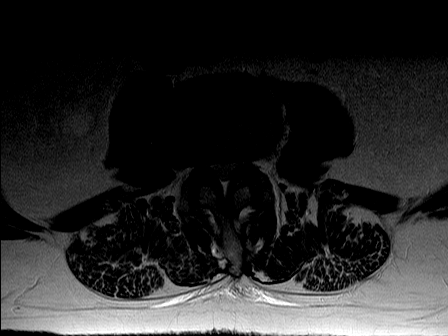
[im 23/29]
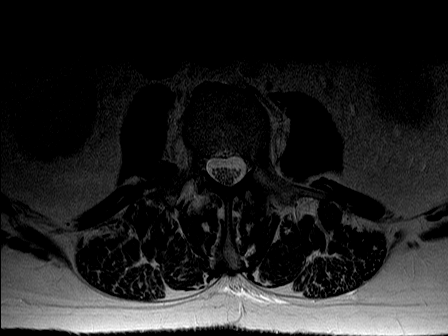
[im 25/29]
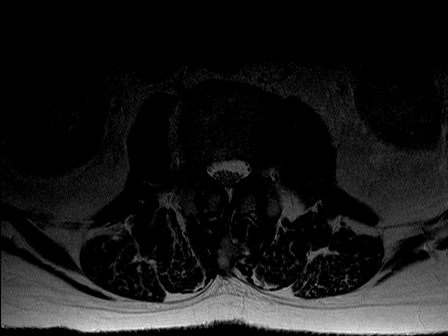
[im 27/29]
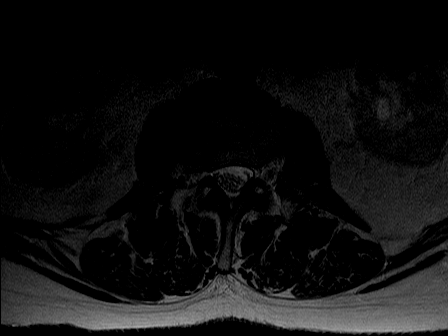

[Series 6: T1 · axial · 4.5mm · 0.86mm/px · z∈[-123,+65]mm · 9 of 26 slices shown (2 of 2)]
[im 1/26]
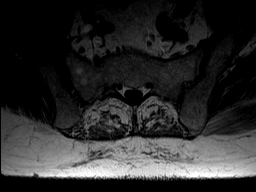
[im 4/26]
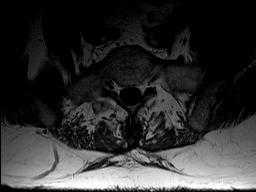
[im 8/26]
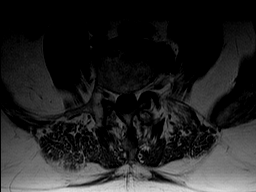
[im 11/26]
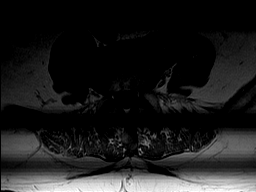
[im 13/26]
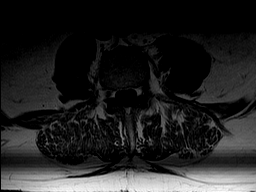
[im 15/26]
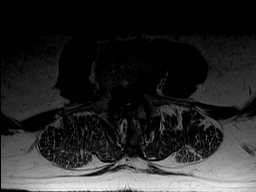
[im 18/26]
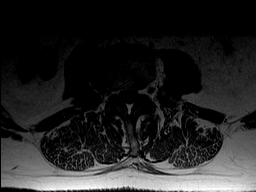
[im 22/26]
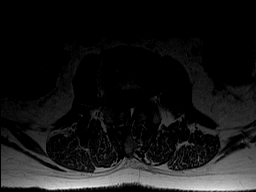
[im 26/26]
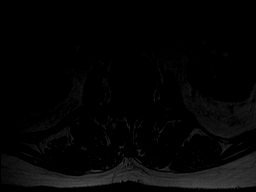

[36 of 48 positions shown; findings below may reference images not displayed]

EXAM

MR lumbar spine wo con

INDICATION

weakness
CHRONIC NEUROPATHY TO BILATERAL LEGS AND FEET.  RG

TECHNIQUE

MRI of the lumbar spine was performed with multiplanar fast spin echo sequences. No intravenous
contrast was administered.

COMPARISONS

None available at the time of dictation.

FINDINGS

Anatomy: Normal lordotic curvature of the lumbar spine. No spondylolisthesis.

Vertebral bodies: No  compression fracture.

Spinal canal: No spinal canal narrowing.

T12-L1: No disc herniation. No spinal canal narrowing or neural foraminal narrowing.

L1-L2: Decreased intervertebral disc space. Mild right neural foraminal stenosis. No spinal canal
stenosis.

L2-L3: Decreased intervertebral disc space with disc osteophyte complex. Moderate right neural
foraminal stenosis. The spinal canal is patent.

L3-L4: Decreased intervertebral disc space with disc osteophyte complex. Mild bilateral neural
foraminal stenosis. There is ligamentum flavum hypertrophy and facet arthropathy resulting in severe
spinal canal stenosis with compression of the nerve roots (axial series 05/10).

L4-L5: Decreased intervertebral disc space with disc osteophyte complex. There is moderate left and
mild right neural foraminal narrowing. No spinal canal stenosis is seen. Mild facet arthropathy.

L5-S1: Decreased intervertebral disc space with disc osteophyte complex. Trace retrolisthesis of L5
on S1. This results in moderate bilateral neural foraminal stenosis. No spinal canal stenosis.

Visualized sacrum and bony pelvis: Normal bone marrow signal of the visualized sacrum and bony
pelvis.

Other findings: No abnormality of the visualized abdomen/pelvis.

IMPRESSION

Multilevel degenerative disc disease resulting in up to moderate neural foraminal and severe spinal
canal stenosis as detailed above.

Note: The following findings are so common in people without low back pain that while we report
their presence, they must be interpreted with caution and in the context of the clinical situation.
(Reference --Jarvik et al, Spine 0009)

Findings (prevalence in patients without low back pain)

Disc degeneration (decreased T2 signal, height loss, bulge) (91%)

Disc T2 - signal loss (83%)

Disc height loss (56%)

Disc bulge (64%)

Dis protrusion (32%)

Annular tear (38 [IP_ADDRESS]

Tech Notes:

CHRONIC NEUROPATHY TO BILATERAL LEGS AND FEET.  RG

## 2020-09-10 IMAGING — MR C-spine^Routine
4 series · 37 of 48 positions shown · non-contrast
Comparison: none

[Series 2: T2 · sagittal · 3.0mm · 0.54mm/px · 8 of 13 slices shown]
[im 1/13]
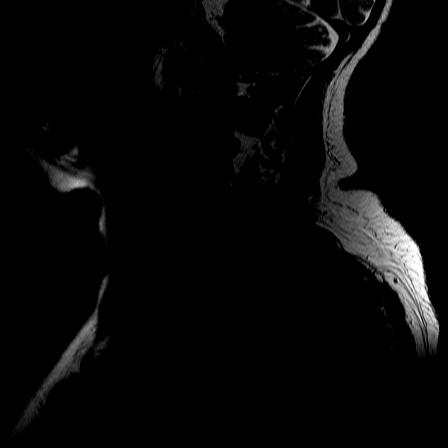
[im 2/13]
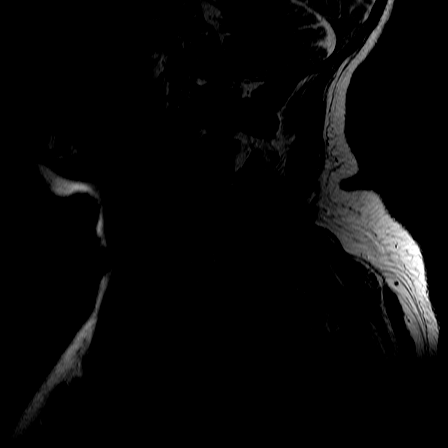
[im 4/13]
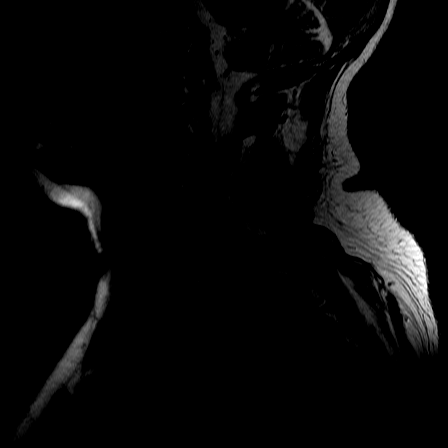
[im 6/13]
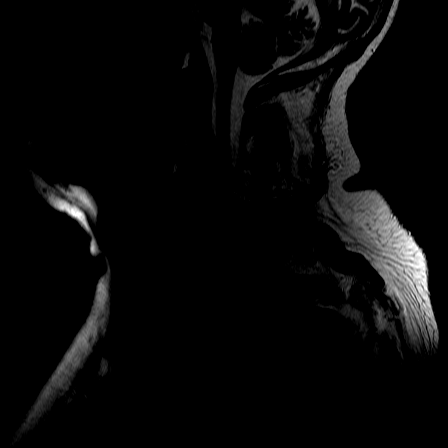
[im 7/13]
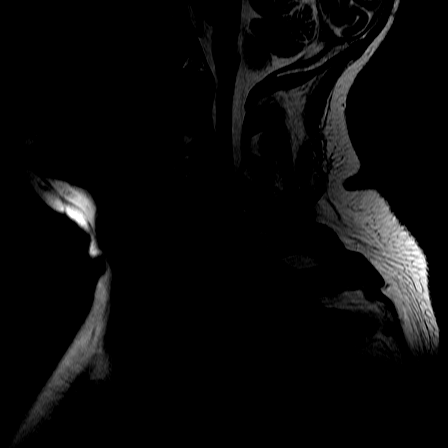
[im 9/13]
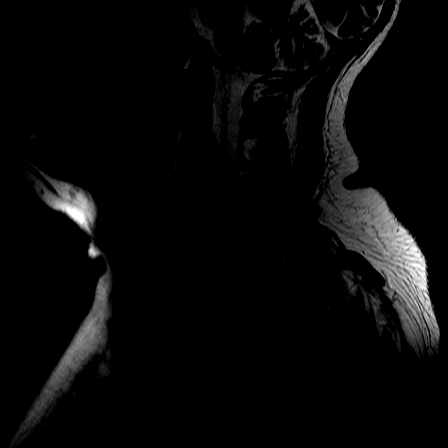
[im 11/13]
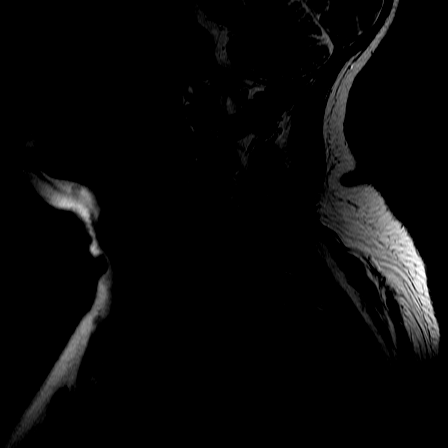
[im 13/13]
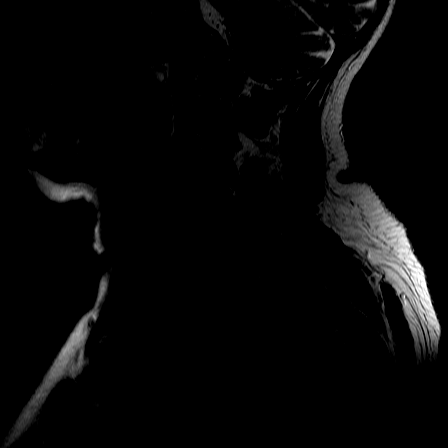

[Series 3: T1 · sagittal · 3.0mm · 0.75mm/px · 9 of 13 slices shown (1 of 2)]
[im 1/13]
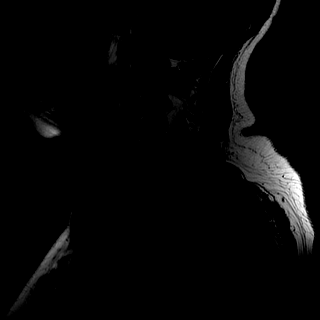
[im 2/13]
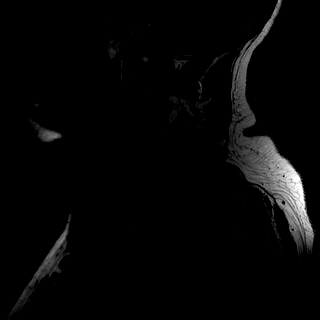
[im 4/13]
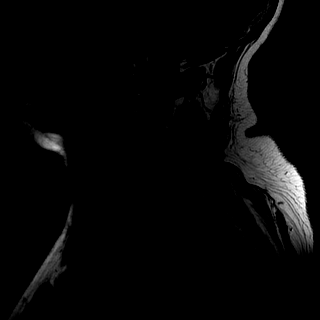
[im 5/13]
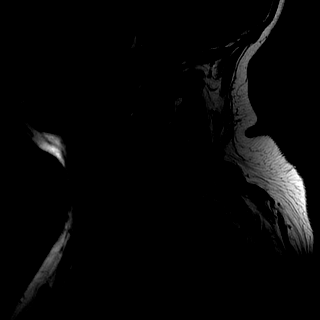
[im 7/13]
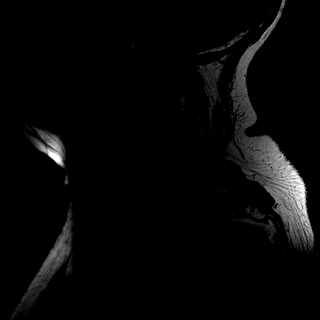
[im 8/13]
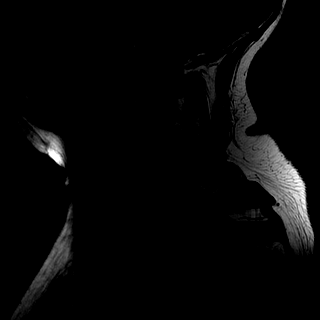
[im 10/13]
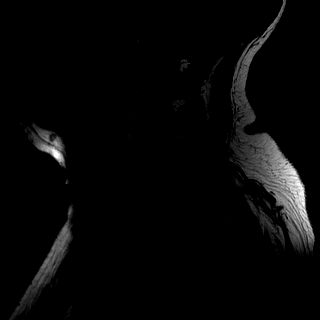
[im 11/13]
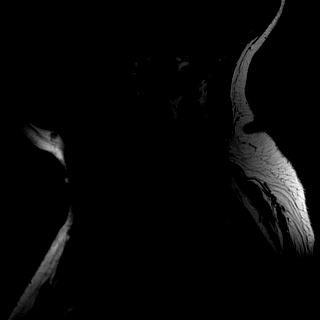
[im 13/13]
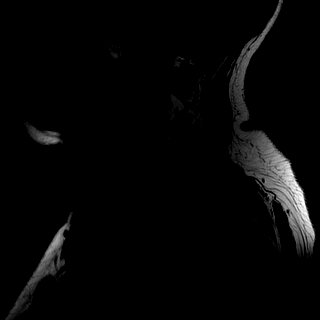

[Series 5: provider echo axial · axial · 3.0mm · 0.39mm/px · z∈[-48,+36]mm · 10 of 24 slices shown]
[im 2/24]
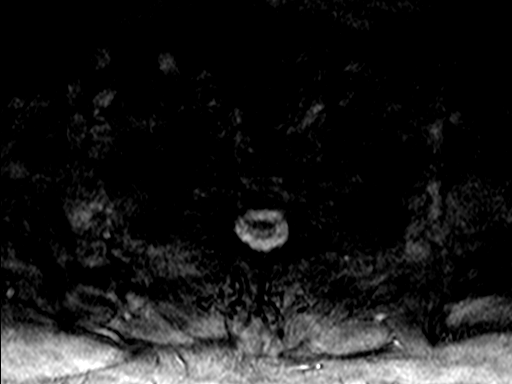
[im 4/24]
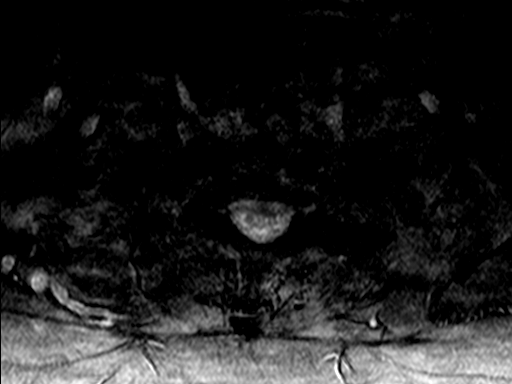
[im 5/24]
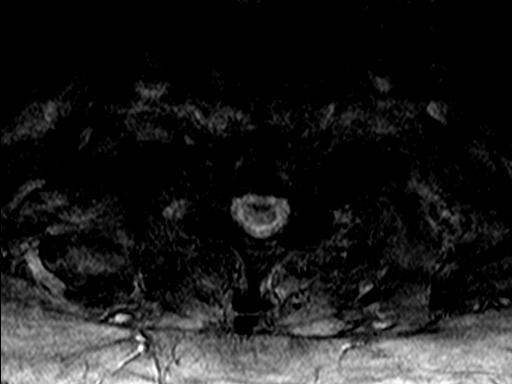
[im 8/24]
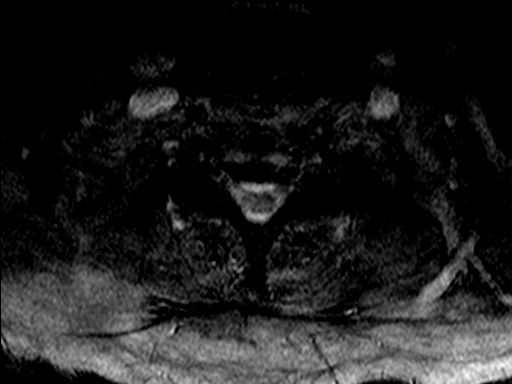
[im 11/24]
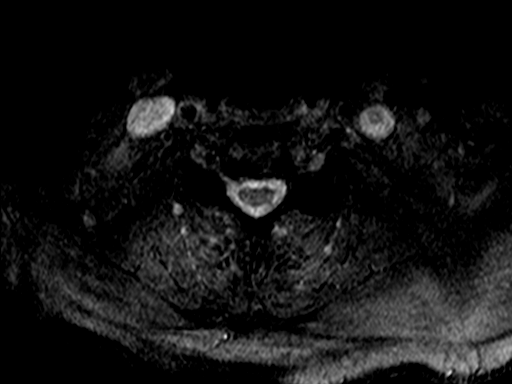
[im 13/24]
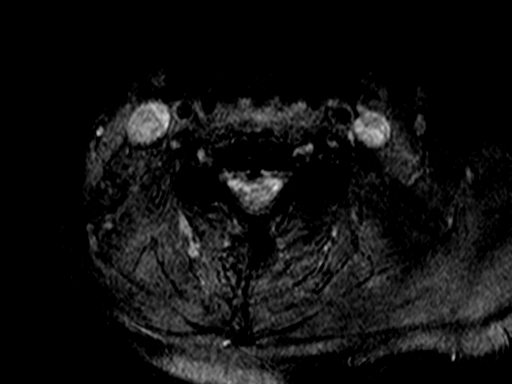
[im 14/24]
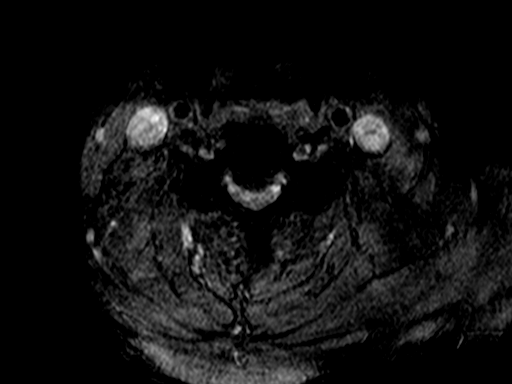
[im 17/24]
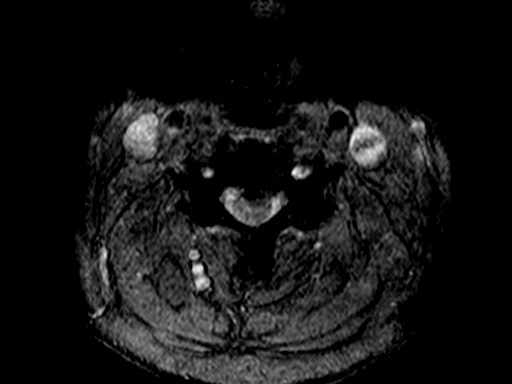
[im 20/24]
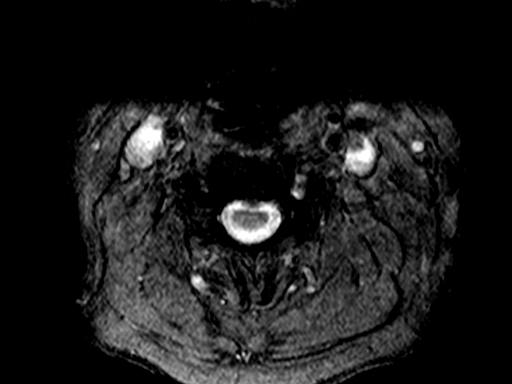
[im 24/24]
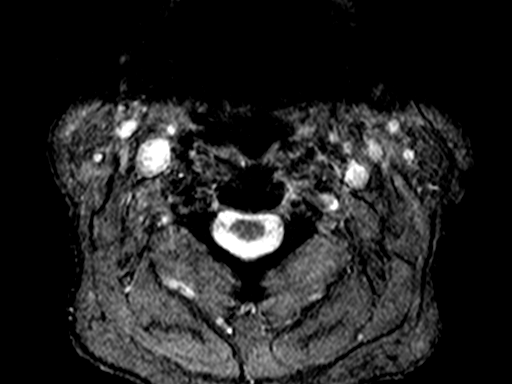

[Series 6: T1 · axial · 3.0mm · 0.78mm/px · z∈[-52,+36]mm · 10 of 22 slices shown (2 of 2)]
[im 1/22]
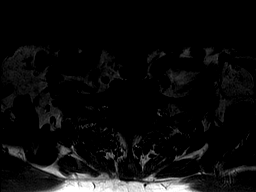
[im 2/22]
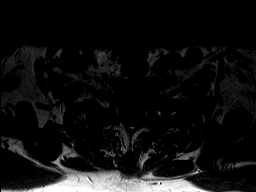
[im 4/22]
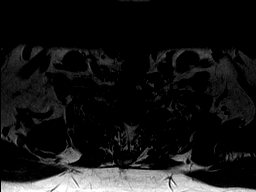
[im 7/22]
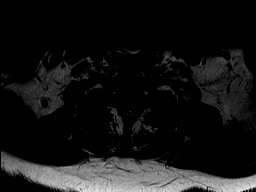
[im 10/22]
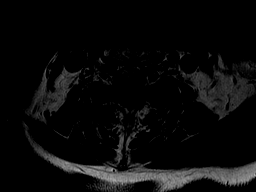
[im 11/22]
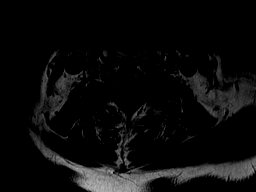
[im 13/22]
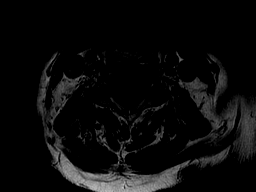
[im 16/22]
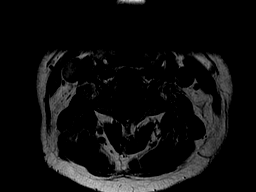
[im 19/22]
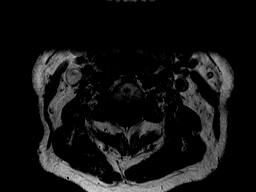
[im 22/22]
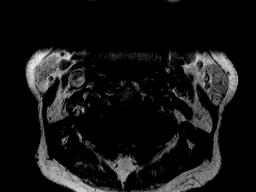

[37 of 48 positions shown; findings below may reference images not displayed]

EXAM

MR cervical spine wo con

INDICATION

Pain

TECHNIQUE

Multiplanar, multisequence imaging of the cervical spine without contrast.

COMPARISONS

None available at the time of dictation.

FINDINGS

ANATOMY: Normal cervical lordosis. No spondylolisthesis.

VERTEBRAL BODIES: No endplate compression fracture. No significant endplate degenerative change.

C2-C3: Decreased intervertebral disc space with concentric disc bulge. No neural foraminal or
spinal canal stenosis.

C3-C4: Decreased intervertebral disc space with disc osteophyte complex. Severe left and moderate
right neural foraminal stenosis. Moderate spinal canal stenosis with compression of the cervical
cord.

C4-C5: Decreased intervertebral disc space with concentric disc bulge. There is severe neural
foraminal stenosis and severe spinal canal stenosis with compression of the cervical cord.

C5-C6: Minimally decreased intervertebral disc space. Mild bilateral neural foraminal stenosis. No
spinal canal stenosis.

C6-C7: Unremarkable.

C7-T1: Unremarkable.

OTHER: No abnormality of the visualized abdomen/pelvis.

IMPRESSION

Multilevel degenerative disc changes resulting in up to severe neural foraminal stenosis and severe
spinal canal stenosis as detailed above.

Tech Notes:

CHRONIC NEUROPATHY TO BILATERAL LEGS AND FEET.  RG

## 2020-09-10 NOTE — Telephone Encounter
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 He saw [facility] due to having recent falls and neuropathy, during work up with them they drew SPEP and IFES which were abnormal. He is working up with neurology ALS due to strong family history.   SPEP: Spike, probably monoclonal, in beta/gamma region  IFES: IGG kappa paraprotein     Pt is if having free light chains, immunoglobulins drawn at local hospital and these results have been requested and will be scanned in once received.     Physician Info:   Referring Physician:  Toma Aran MD    Contact Name & Number:  [phone] [place]     Allergies reviewed and verified with the patient, and documented in Epic:  Yes    Comments: COVID-19 guidelines reviewed with patient, including: visitor and universal masking policies, and a temperature check at the facility entrance upon arrival.

## 2020-09-11 ENCOUNTER — Encounter: Admit: 2020-09-11 | Discharge: 2020-09-11 | Payer: MEDICARE

## 2020-09-11 DIAGNOSIS — R69 Illness, unspecified: Secondary | ICD-10-CM

## 2020-09-15 ENCOUNTER — Encounter: Admit: 2020-09-15 | Discharge: 2020-09-15 | Payer: MEDICARE

## 2020-09-15 DIAGNOSIS — R253 Fasciculation: Secondary | ICD-10-CM

## 2020-09-15 DIAGNOSIS — M48061 Spinal stenosis, lumbar region without neurogenic claudication: Secondary | ICD-10-CM

## 2020-09-15 DIAGNOSIS — M4802 Spinal stenosis, cervical region: Secondary | ICD-10-CM

## 2020-09-15 DIAGNOSIS — R531 Weakness: Secondary | ICD-10-CM

## 2020-09-15 DIAGNOSIS — W19XXXA Unspecified fall, initial encounter: Secondary | ICD-10-CM

## 2020-09-15 DIAGNOSIS — Z820 Family history of epilepsy and other diseases of the nervous system: Secondary | ICD-10-CM

## 2020-09-15 NOTE — Telephone Encounter
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 He had surgery with Dr. Felipa Eth years ago and would like to se if he could see him again. Will place referral and send message.    Per notes, fascics in deltoid Right mainly. Dr. Jaclyn Shaggy has noted them in other extremities as well. Not particularly myelopathic in BUE, LE hyporeflexic likely due to lumbar spine disease. Main symptoms are numbness in hand/arm with positional changes, falls x 3. Discussed the episodes of feeling acutely short of breath needing to brace hands on a table to catch a breath (C3/4/5 narrowing), but he says that this is very intermittent and has been ongoing for 5 years, so unclear if related.           MRI cervical spine w/o contrast at [facility] 09/10/2020: C3-4 severe left and moderate right neuroforaminal stenosis with moderate spinal canal stenosis with compression of cervical cord.  C4-C5 with severe neuroforaminal stenosis and severe spinal stenosis with compression of the cervical cord.  MRI lumbar spine w/o contrast at [facility] 09/10/2020: L3-L4 ligamentum flavum hypertrophy and facet arthropathy resulting in severe spinal canal stenosis with compression of the nerve roots (axial series 05-10)        Cervical Spine:           Lumbar Spine:

## 2020-09-21 ENCOUNTER — Encounter: Admit: 2020-09-21 | Discharge: 2020-09-21 | Payer: MEDICARE

## 2020-09-21 DIAGNOSIS — R531 Weakness: Secondary | ICD-10-CM

## 2020-09-21 DIAGNOSIS — Z820 Family history of epilepsy and other diseases of the nervous system: Secondary | ICD-10-CM

## 2020-09-21 DIAGNOSIS — W19XXXA Unspecified fall, initial encounter: Secondary | ICD-10-CM

## 2020-09-21 DIAGNOSIS — R253 Fasciculation: Secondary | ICD-10-CM

## 2020-09-22 ENCOUNTER — Encounter: Admit: 2020-09-22 | Discharge: 2020-09-22 | Payer: MEDICARE

## 2020-09-22 DIAGNOSIS — R778 Other specified abnormalities of plasma proteins: Secondary | ICD-10-CM

## 2020-09-22 DIAGNOSIS — D892 Hypergammaglobulinemia, unspecified: Secondary | ICD-10-CM

## 2020-09-22 LAB — CBC AND DIFF
BASOPHILS %: 0.6 (ref 0.0–2.0)
BASOPHILS: 0.1 (ref 0.0–0.2)
EOSINOPHIL %: 1.3 (ref 0.0–6.0)
EOSINOPHIL COUNT: 0.1 (ref 0.0–0.6)
HEMATOCRIT: 50 (ref 42.0–52.0)
HEMOGLOBIN: 15 (ref 14.0–18.0)
LYMPHOCYTES %: 15 — AB (ref 18.0–47.0)
LYMPHOCYTES: 1.5 (ref 0.9–5.1)
MCH: 28 (ref 0–31)
MCHC: 31 (ref 30.0–34.0)
MCV: 92 (ref 80.0–99.0)
MONOCYTES %: 5.5 (ref 0.0–10.0)
MONOCYTES, PNH: 0.5 (ref 0.1–0.9)
NEUTROPHILS %: 76 — AB (ref 40.0–75.0)
NEUTROPHILS: 7.1 (ref 1.9–8.1)
PLATELET COUNT: 267 (ref 130–400)
RBC COUNT: 5.5 (ref 4.70–6.10)
RDW: 13 (ref 11.5–14.5)
WBC COUNT: 9.2 (ref 4.8–10.8)

## 2020-09-22 LAB — BUN: BLD UREA NITROGEN: 21 (ref 8.4–25.7)

## 2020-09-22 LAB — CREATININE: CREATININE: 1.2 (ref 0.72–1.25)

## 2020-09-24 NOTE — Progress Notes
Name: CARA AGUINO          MRN: [id number]      DOB: Nov 01, 1950      AGE: 70 y.o.   DATE OF SERVICE: 09/25/2020    Subjective:             Reason for Visit:  Heme/Onc Care      DEMAR SHAD is a 70 y.o. male.         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 Ala Dach.  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  Labs performed including SPEP.  This revealed paraprotein with M spike of 0.34 leading to hematology consultation..    Labs from July 2022 show M spike of 0.34 on serum protein electrophoresis.  Immunofixation consistent with IgG kappa.  Creatinine normal  at 1.21.  Hemoglobin 15.8 with hematocrit of 50.  Mild increase in neutrophils.  B12 and folate normal.  Labs performed through Dr. Herschell Dimes 09/10/20 showed normal immunoglobulins and normal light chains.  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   Respiratory: Negative for cough, shortness of breath and wheezing.    Cardiovascular: Negative for chest pain and palpitations.   Gastrointestinal: Negative for abdominal pain, diarrhea, nausea and vomiting.   Musculoskeletal: Positive for back pain.   Neurological: Positive for numbness. Negative for dizziness and light-headedness.         Objective:         ? acetaminophen SR (TYLENOL) 650 mg tablet Take 650 mg by mouth daily.   ? docusate (COLACE) 100 mg capsule Take 100 mg by mouth every 48 hours.   ? ERGOCALCIFEROL (VITAMIN D2) (VITAMIN D PO) Take 2,000 Units by mouth daily.   ? esomeprazole DR (NEXIUM) 20 mg capsule Take 20 mg by mouth as Needed (1-2x every 3 weeks). Take on an empty stomach at least 1 hour before or 2 hours after food.   ? eszopiclone (LUNESTA) 3 mg tablet Take 3 mg by mouth at bedtime as needed for Sleep.   ? glucosamine/chondr su A sod (OSTEO BI-FLEX PO) Take 2 tablets by mouth daily. Indications: per patient takes 2 tab per day   ? mirabegron (MYRBETRIQ) 50 mg ER tablet Take 50 mg by mouth daily.   ? tadalafiL (CIALIS) 20 mg tablet Take 10 mg by mouth as Needed for Erectile dysfunction.     Vitals:    09/25/20 1330   BP: (!) 146/81   BP Source: Arm, Left Upper   Pulse: 78   Temp: 36.3 ?C (97.4 ?F)   Resp: 16   SpO2: 99%   TempSrc: Temporal   PainSc: Zero   Weight: 111.6 kg (246 lb)   Height: 179.1 cm (5' 10.51)     Body mass index is 34.79 kg/m?Marland Kitchen     Pain Score: Zero       Fatigue Scale: 0-None    Pain Addressed:  Current regimen working to control pain.    Patient Evaluated for a Clinical Trial: Patient not eligible for a treatment trial (including not needing treatment, needs palliative care, in remission).     [place] Cooperative Oncology Group performance status is 1, Restricted in physically strenuous activity but ambulatory and able to carry out work of a light or sedentary nature, e.g., light house work,  office work.     Physical Exam  Constitutional:       General: He is not in acute distress.  HENT:      Head: Normocephalic.   Eyes:      General: No scleral icterus.     Extraocular Movements: Extraocular movements intact.   Neurological:      Mental Status: He is alert and oriented to person, place, and time.   Psychiatric:         Mood and Affect: Mood normal.         Behavior: Behavior normal.         Thought Content: Thought content normal.         Judgment: Judgment normal.          09/10/2020: IgG 1054, IgA 175, IgM 107  Kappa 14.1, lambda 12.3, ratio 1.15.     Assessment and Plan:  IgG paraproteinemia: Labs reviewed.  No lytic lesions or osseous lesion seen on the MRI's.  No anemia, renal insufficiency.  Reviewed lab results, different plasma cell dyscrasias and diagnostic work-up.  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  With very small M spike and normal labs otherwise, can continue observation with repeat labs in 1 year.  Also reviewed Mayo criteria for risk stratification for MGUS.  MGUS not likely  etiology for current neurological symptoms.  Continue follow-up with neurology    I appreciate the opportunity to participate in Dr. Gilles Chiquito care.  Parts of this note were created with voice recognition software. Please excuse any grammatical or typographical errors.

## 2020-09-25 ENCOUNTER — Encounter: Admit: 2020-09-25 | Discharge: 2020-09-25 | Payer: MEDICARE

## 2020-09-25 DIAGNOSIS — IMO0002 Ulcer: Secondary | ICD-10-CM

## 2020-09-25 DIAGNOSIS — I00 Rheumatic fever without heart involvement: Secondary | ICD-10-CM

## 2020-09-25 DIAGNOSIS — D472 Monoclonal gammopathy: Secondary | ICD-10-CM

## 2020-09-25 DIAGNOSIS — R0609 Other forms of dyspnea: Secondary | ICD-10-CM

## 2020-09-25 DIAGNOSIS — R12 Heartburn: Secondary | ICD-10-CM

## 2020-09-25 DIAGNOSIS — K219 Gastro-esophageal reflux disease without esophagitis: Secondary | ICD-10-CM

## 2020-09-25 DIAGNOSIS — Z0389 Encounter for observation for other suspected diseases and conditions ruled out: Secondary | ICD-10-CM

## 2020-09-25 DIAGNOSIS — N189 Chronic kidney disease, unspecified: Secondary | ICD-10-CM

## 2020-09-25 DIAGNOSIS — N429 Disorder of prostate, unspecified: Secondary | ICD-10-CM

## 2020-09-25 DIAGNOSIS — E785 Hyperlipidemia, unspecified: Secondary | ICD-10-CM

## 2020-10-06 ENCOUNTER — Encounter: Admit: 2020-10-06 | Discharge: 2020-10-06 | Payer: MEDICARE

## 2020-10-08 ENCOUNTER — Ambulatory Visit: Admit: 2020-10-08 | Discharge: 2020-10-08 | Payer: MEDICARE

## 2020-10-08 ENCOUNTER — Encounter: Admit: 2020-10-08 | Discharge: 2020-10-08 | Payer: MEDICARE

## 2020-10-08 DIAGNOSIS — W19XXXA Unspecified fall, initial encounter: Secondary | ICD-10-CM

## 2020-10-08 DIAGNOSIS — Z820 Family history of epilepsy and other diseases of the nervous system: Secondary | ICD-10-CM

## 2020-10-08 DIAGNOSIS — R253 Fasciculation: Secondary | ICD-10-CM

## 2020-10-08 DIAGNOSIS — R531 Weakness: Secondary | ICD-10-CM

## 2020-10-08 IMAGING — CR SHOULDCMLT
3 series · 3 of 3 positions shown · non-contrast
Comparison: none

[shoulder external]
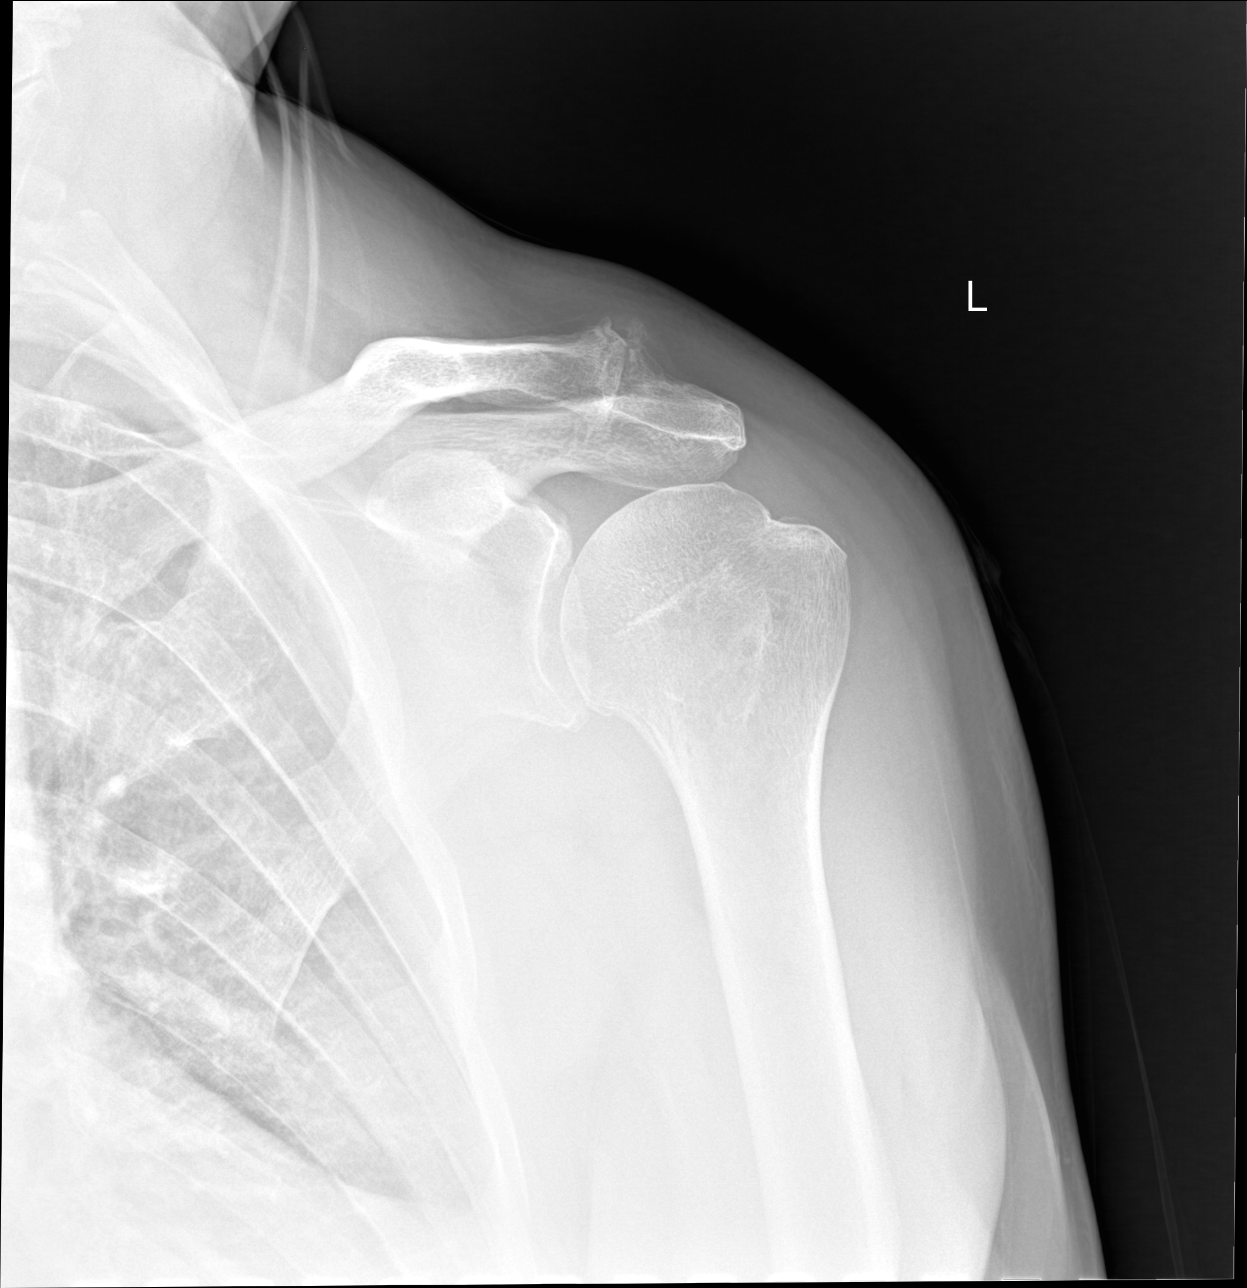

[shoulder y-view]
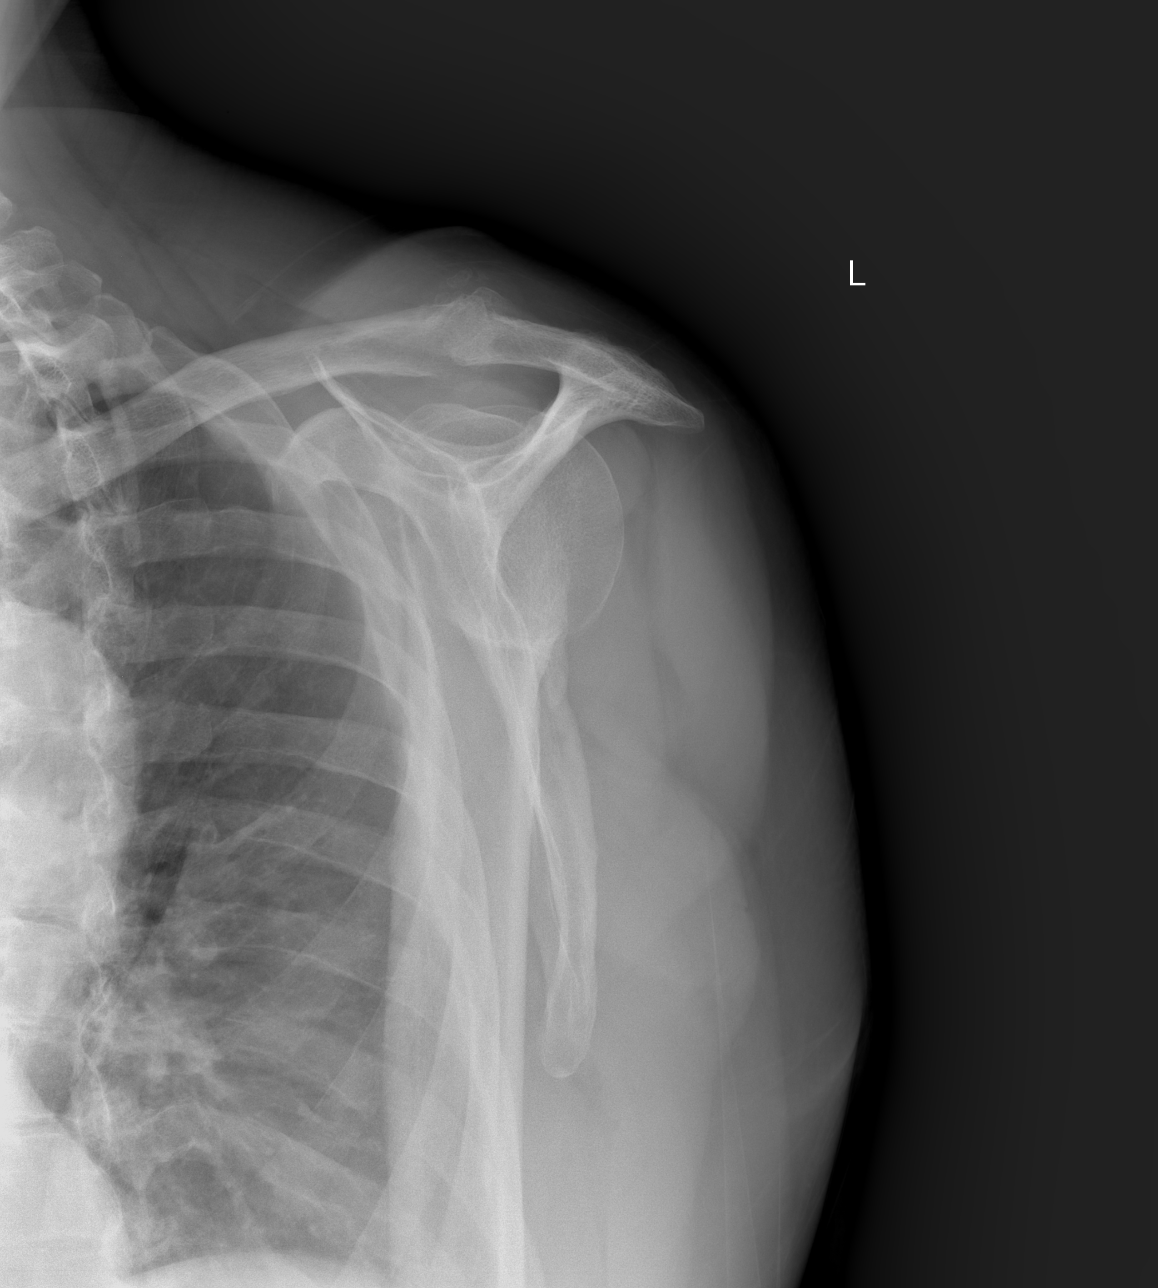

[shoulder axillary]
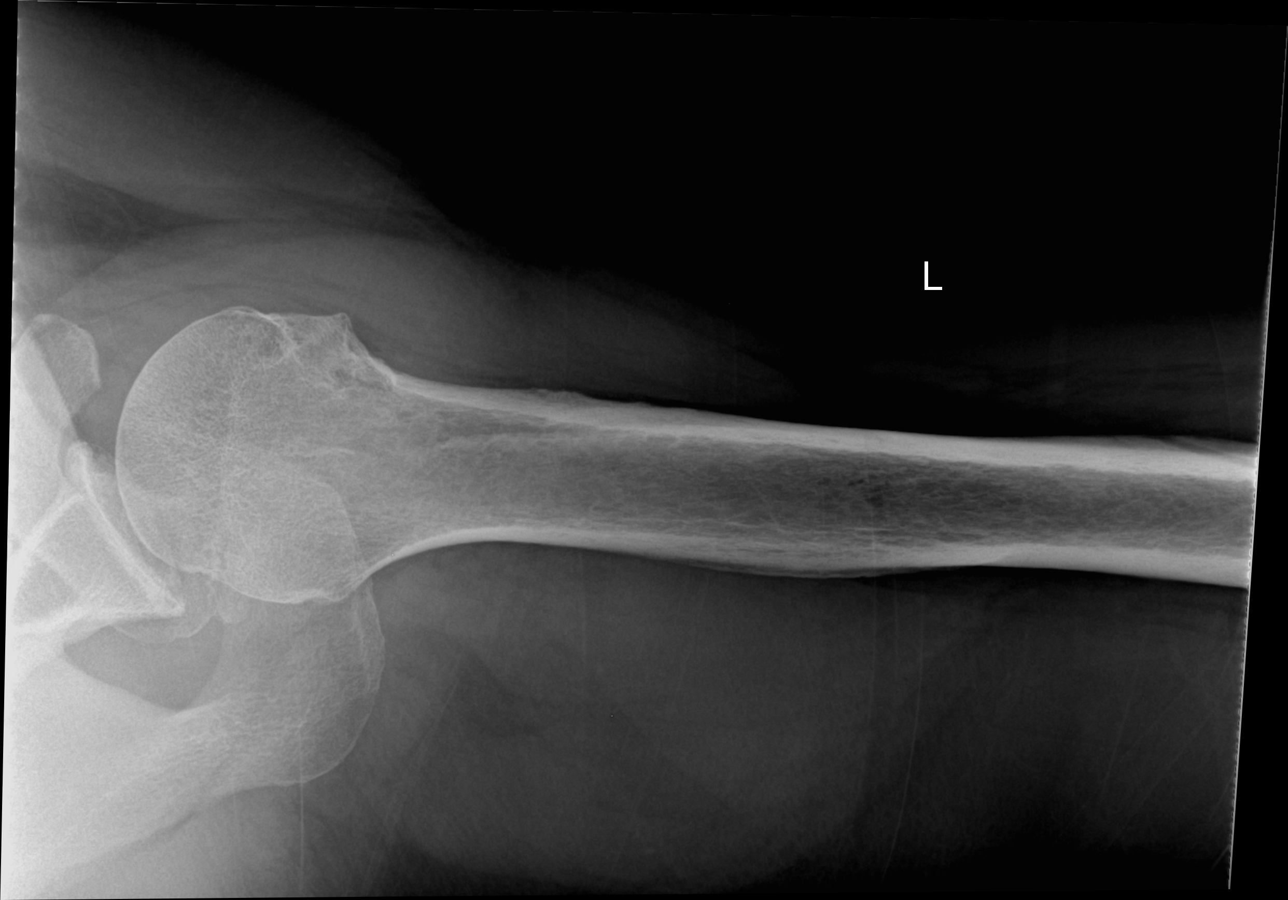

[3 of 3 positions shown; findings below may reference images not displayed]

DIAGNOSTIC STUDIES

EXAM

XR shoulder left, complete

INDICATION

left shoulder pain
lt shoulder pain

TECHNIQUE

AP lateral and axillary views

COMPARISONS

None available

FINDINGS

Prominent hypertrophic degenerative changes of the AC joint are seen. No fractures are evident.

IMPRESSION

Degenerative changes of the AC joint.

Tech Notes:

lt shoulder pain

## 2020-10-08 NOTE — Progress Notes
Procedure Time Out Check List:    Prior to the start of the procedure, I personally confirmed the following:      Patient Identity (name & date of birth): Yes  Procedure: Yes  Site: Yes  Body Part: Yes    The risks of the procedure, including infection, bleeding, and pain, were discussed with the patient.      Test was completed without complication.       , MD  Associate Professor  Neurology/Neuromuscular Medicine

## 2020-10-12 ENCOUNTER — Encounter: Admit: 2020-10-12 | Discharge: 2020-10-12 | Payer: MEDICARE

## 2020-10-12 NOTE — Telephone Encounter
EMG/NCS 10/08/20 by Dr. Loistine Simas: right active C6 radiculopathy. Fasciculations are seen in the Right C6-7 distribution, non specific, but could be secondary to radiculopathy. The study does not meet electrodiagnositic criteria for ALS. Mild chronic L5-S1 radiculopathy.       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 He has an active right C6 radiculopathy, which is a bit lower than the spinal cord /compression changes we had discussed earlier. I think getting the spine looked at 11/25/20 with Dr. Rosana Hoes is definitely step one.     Please see if he has had PFTs completed in the past with shortness of breath he describes. If not, please see how he feels about completing those and place orders, if agreeable.       Reviewed hematology notes.

## 2020-10-28 ENCOUNTER — Encounter: Admit: 2020-10-28 | Discharge: 2020-10-28 | Payer: MEDICARE

## 2020-10-28 ENCOUNTER — Ambulatory Visit: Admit: 2020-10-28 | Discharge: 2020-10-28 | Payer: MEDICARE

## 2020-10-28 DIAGNOSIS — IMO0002 Ulcer: Secondary | ICD-10-CM

## 2020-10-28 DIAGNOSIS — E785 Hyperlipidemia, unspecified: Secondary | ICD-10-CM

## 2020-10-28 DIAGNOSIS — R12 Heartburn: Secondary | ICD-10-CM

## 2020-10-28 DIAGNOSIS — M4802 Spinal stenosis, cervical region: Secondary | ICD-10-CM

## 2020-10-28 DIAGNOSIS — K219 Gastro-esophageal reflux disease without esophagitis: Secondary | ICD-10-CM

## 2020-10-28 DIAGNOSIS — Z0389 Encounter for observation for other suspected diseases and conditions ruled out: Secondary | ICD-10-CM

## 2020-10-28 DIAGNOSIS — N189 Chronic kidney disease, unspecified: Secondary | ICD-10-CM

## 2020-10-28 DIAGNOSIS — R778 Other specified abnormalities of plasma proteins: Secondary | ICD-10-CM

## 2020-10-28 DIAGNOSIS — I00 Rheumatic fever without heart involvement: Secondary | ICD-10-CM

## 2020-10-28 DIAGNOSIS — N429 Disorder of prostate, unspecified: Secondary | ICD-10-CM

## 2020-10-28 DIAGNOSIS — R0609 Other forms of dyspnea: Secondary | ICD-10-CM

## 2020-10-28 DIAGNOSIS — M48061 Spinal stenosis, lumbar region without neurogenic claudication: Secondary | ICD-10-CM

## 2020-10-28 NOTE — Progress Notes
Subjective:       History of Present Illness  Joe Whitehead is a 70 y.o. male.     Initial Visit Date: 07.05.2022  Reason for Visit: gait issues  PCP: Dr. 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 Earlene Plater soon 11/30/20 with neurosurgery.   He did have left arm EMG/NCS in [place]. He has h/o ulnar nerve issues. He says motor function was normal. Sensory was delayed. He says that his left hand goes to sleep when he sleeps with his arm above his head.  He feels that this has been a bit worse.     He has had his hip and both shoulders injected and is feeling better.     No falls reported since last visit.     He is waiting on his knee surgery.     He does have some fasciculations he has noticed in the past.        EMG/NCS 10/08/20 by Dr. Ruel Favors: right active C6 radiculopathy. Fasciculations are seen in the Right C6-7 distribution, non specific, but could be secondary to radiculopathy. The study does not meet electrodiagnositic criteria for ALS. Mild chronic L5-S1 radiculopathy.   ALS genetic testing: negative  Hematology notes from Dr. Stasia Cavalier 09/25/2020: felt to be MGUS  MRI cervical spine w/o contrast at [facility] 09/10/2020: C3-4 severe left and moderate right neuroforaminal stenosis with moderate spinal canal stenosis with compression of cervical cord.  C4-C5 with severe neuroforaminal stenosis and severe spinal stenosis with compression of the cervical cord.  MRI lumbar spine w/o contrast at [facility] 09/10/2020: L3-L4 ligamentum flavum hypertrophy and facet arthropathy resulting in severe spinal canal stenosis with compression of the nerve roots (axial series 05-10)  Labs 09/09/20: B12 284  IFE: IgG kappa paraprotein  SPEP: spike, probably in monoclonal beta/gamma region  ?  ?  ?  Cervical Spine:         ?  Lumbar Spine:         Medical History:   Diagnosis Date   ? 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 DOE (dyspnea on exertion) 09/22/2016   ? GERD (gastroesophageal reflux disease) 09/22/2016   ? Heartburn    ? Hyperlipemia 09/22/2016   ? Observation for suspected cardiovascular disease 09/22/2016    03/19/2010  Exercise Echo:  NSR PVC's noted throughout study.  Normal LV sz and function.  Normal hyperdynamic response for all segments.  No Echocardiographic evidence of ischemia.     ? Prostate disorder    ? Rheumatic fever    ? Ulcer     Stomach Issues     Surgical History:   Procedure Laterality Date   ? SPINE SURGERY  1997    R L5-S1 microdiscectomy   ? Colonoscopy N/A 01/03/2019    Performed by Tempie Hoist, DO [facility]   ? COLONOSCOPY WITH SNARE REMOVAL TUMOR/ POLYP/ OTHER LESION  01/03/2019    Performed by Tempie Hoist, DO at [facility] ENDO   ? COLONOSCOPY WITH BIOPSY - FLEXIBLE  01/03/2019    Performed by Tempie Hoist, DO at [facility] END[facility]   ? BLADDER SUSPENSION  Dr. Larwance Rote - urology   ? CLAVICLE SURGERY  13 and 17 years    X2 Left   ? ELBOW SURGERY  1974, 1989   ? KNEE ARTHROSCOPY      bilateral   ? NERVE BIOPSY     ? ULNAR TUNNEL RELEASE Right      Social History     Tobacco Use   ? Smoking status: Never Smoker   ? Smokeless tobacco: Never Used   Substance Use Topics   ? Alcohol use: Yes   ? Drug use: No     Family History   Problem Relation Age of Onset   ? COPD Mother    ? COPD Father    ? Multiple sclerosis Brother    ? Neurologic Disorder Paternal Aunt         ALS     Allergies   Allergen Reactions   ? Crestor [Rosuvastatin] MUSCLE PAIN     aching muscles        Review of Systems   Musculoskeletal: Positive for arthralgias, back pain and gait problem.         Objective:         ? acetaminophen SR (TYLENOL) 650 mg tablet Take 650 mg by mouth daily.   ? docusate (COLACE) 100 mg capsule Take 100 mg by mouth every 48 hours.   ? ERGOCALCIFEROL (VITAMIN D2) (VITAMIN D PO) Take 2,000 Units by mouth daily.   ? esomeprazole DR (NEXIUM) 20 mg capsule Take 20 mg by mouth as Needed (1-2x every 3 weeks). Take on an empty stomach at least 1 hour before or 2 hours after food.   ? eszopiclone (LUNESTA) 3 mg tablet Take 3 mg by mouth at bedtime as needed for Sleep.   ? glucosamine/chondr su A sod (OSTEO BI-FLEX PO) Take 2 tablets by mouth daily. Indications: per patient takes 2 tab per day   ? mirabegron (MYRBETRIQ) 50 mg ER tablet Take 50 mg by mouth daily.   ? tadalafiL (CIALIS) 20 mg tablet Take 10 mg by mouth as Needed for Erectile dysfunction.     There were no vitals filed for this visit.  There is no height or weight on file to calculate BMI.         General: alert, oriented x 3   Reflexes: (Right/Left) Biceps 1+/1+, Triceps 2/2, Brachioradialis 1/1, Knee Jerks 0/0, Ankle Jerks 0/0, negative hoffman's  PRIOR:   Speech: normal, no dysarthria  Cranial nerves:II Visual fields full to finger counting; III, IV, VI PERRL, extraocular muscles intact, no nystagmus; V facial sensation intact; VII facial expression symmetric; VIII hearing intact to conversation; IX, X palate rise symmetric; XI sternocleidomastoid strength intact; XII tongue midline, no fasciculations present  Motor: (Right/Left)  NE/NF 5/5, Deltoid 5/5, Biceps 5/5, Triceps 5/5, Finger ext 5/5, interossei 5/5, Hip Flexion 4/4, Knee ext 5/5, Knee flex 5/5, Ankle dorsiflexion 5/5, Ankle plantarflexion 5/5 - + fasciculations in right deltoid  Sensory: Normal to light touch. Proprioception intact at toes. Pinprick is normal in BLE overall, as well as BUE. Vibration in seconds R/L toes 6/6  Coordination: normal finger nose finger, rapid alternating movements and finger tapping speed normal, and heel to shin smooth without ataxia  Abnormal Movements: mild right arm resting tremor, no rigidity, no bradykinesia      ASSESSMENT/PLAN:     Joe Whitehead 70 y.o. male is here today for evaluation of fasciculations with negative ALS work up. 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 He is not myelopathic on exam and notes no further falls. 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     1. Cervical spinal stenosis    2. Spinal stenosis of lumbar region, unspecified whether neurogenic claudication present    3. Abnormal SPEP       RECOMMENDATIONS:  ? Continue fall precautions.    ? Please keep appointment with Dr. Earlene Plater about the C/L spine stenosis, as well as the left ulnar neuropathy.        FOLLOWUP PLAN  Return in about 1 year (around 10/28/2021). for full neurologic exam.   The patient is instructed to contact me if there are any concerns with the agreed plan.  Total Time Today was 30 minutes in the following activities: Preparing to see the patient, Obtaining and/or reviewing separately obtained history, Performing a medically appropriate examination and/or evaluation, Counseling and educating the patient/family/caregiver and Documenting clinical information in the electronic or other health record

## 2020-11-19 NOTE — Patient Instructions
It was nice to see you today.  Thank you for choosing to visit our clinic.  Your time is important, and if you had to wait today, we do apologize.  Our goal is to run exactly on time.  However, on occasion, we get behind in clinic due to unexpected patient issues.  Thank you for your patience.    General Instructions:  Scheduling:  Our scheduling phone number is [phone].  Appointment Reminders on your cell phone:  Communication preferences can be managed in MyChart to ensure you receive important appointment notifications  How to reach our office:  Please send a MyChart message to the [facility] (directed to Dr. Davis) or leave a voicemail for the nurse, Jaidan Prevette, at [phone].  How to get a medication refill:  Please use the MyChart Refill request or contact your pharmacy directly to request medication refills.  Please allow 72 business hours for request to be completed.    Support for many chronic illnesses is available through [facility] at [email or web address] or [phone].    For help with MyChart:  please call [phone].    For questions on nights, weekends or holidays:  call the Operator at [phone], and ask for the doctor on call for Neurosurgery.    For more information on spinal conditions:  please visit [email or web address]     Again, thank you for coming in today.

## 2020-11-26 ENCOUNTER — Encounter: Admit: 2020-11-26 | Discharge: 2020-11-26 | Payer: MEDICARE

## 2020-11-26 NOTE — Telephone Encounter
Pt stated that will not be able to get his my chart started until he come in and He said he can come in as early as 1:30pm to do his paper work.

## 2020-11-30 ENCOUNTER — Encounter: Admit: 2020-11-30 | Discharge: 2020-11-30 | Payer: MEDICARE

## 2020-11-30 ENCOUNTER — Ambulatory Visit: Admit: 2020-11-30 | Discharge: 2020-11-30 | Payer: MEDICARE

## 2020-11-30 DIAGNOSIS — E785 Hyperlipidemia, unspecified: Secondary | ICD-10-CM

## 2020-11-30 DIAGNOSIS — K219 Gastro-esophageal reflux disease without esophagitis: Secondary | ICD-10-CM

## 2020-11-30 DIAGNOSIS — I00 Rheumatic fever without heart involvement: Secondary | ICD-10-CM

## 2020-11-30 DIAGNOSIS — N429 Disorder of prostate, unspecified: Secondary | ICD-10-CM

## 2020-11-30 DIAGNOSIS — IMO0002 Ulcer: Secondary | ICD-10-CM

## 2020-11-30 DIAGNOSIS — R12 Heartburn: Secondary | ICD-10-CM

## 2020-11-30 DIAGNOSIS — M4802 Spinal stenosis, cervical region: Secondary | ICD-10-CM

## 2020-11-30 DIAGNOSIS — M48062 Spinal stenosis, lumbar region with neurogenic claudication: Secondary | ICD-10-CM

## 2020-11-30 DIAGNOSIS — Z0389 Encounter for observation for other suspected diseases and conditions ruled out: Secondary | ICD-10-CM

## 2020-11-30 DIAGNOSIS — M48061 Spinal stenosis, lumbar region without neurogenic claudication: Secondary | ICD-10-CM

## 2020-11-30 DIAGNOSIS — R0609 Other forms of dyspnea: Secondary | ICD-10-CM

## 2020-11-30 DIAGNOSIS — N189 Chronic kidney disease, unspecified: Secondary | ICD-10-CM

## 2020-11-30 NOTE — Progress Notes
Subjective:       History of Present Illness  Joe Whitehead is a 69 y.o. male.  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  At this time he denies any signs/symptoms of cervical myelopathy.  He has no significant neck pain or radicular type symptoms.  He has no trouble with fine motor movements or balance when walking.  He does occasionally have some paresthesias in an ulnar distribution with his elbow bent in certain positions.  This immediately resolves with changing position of his elbow.  He does have a low back achiness into the bilateral lower extremities when standing or walking for too long.  His legs will feel heavy and tired.  This improves immediately with recumbency.  This also improves with leaning forward over a shopping cart.  The symptoms have been going on for several years.  He has failed nonoperative management with NSAIDs and pain medications.             Objective:         ? acetaminophen SR (TYLENOL) 650 mg tablet Take 1,300 mg by mouth daily.   ? docusate (COLACE) 100 mg capsule Take 100 mg by mouth every 48 hours.   ? ERGOCALCIFEROL (VITAMIN D2) (VITAMIN D PO) Take 2,000 Units by mouth daily.   ? esomeprazole DR (NEXIUM) 20 mg capsule Take 20 mg by mouth as Needed (1-2x every 3 weeks). Take on an empty stomach at least 1 hour before or 2 hours after food.   ? eszopiclone (LUNESTA) 3 mg tablet Take 3 mg by mouth at bedtime as needed for Sleep.   ? glucosamine/chondr su A sod (OSTEO BI-FLEX PO) Take 2 tablets by mouth daily. Indications: per patient takes 2 tab per day   ? mirabegron (MYRBETRIQ) 50 mg ER tablet Take 50 mg by mouth daily.   ? tadalafiL (CIALIS) 20 mg tablet Take 10 mg by mouth as Needed for Erectile dysfunction.     Vitals:    11/30/20 1339   BP: 137/85   BP Source: Arm, Left Upper   Pulse: 74   SpO2: 99%   PainSc: Zero   Weight: 111.1 kg (245 lb)   Height: 180.3 cm (5' 11)     Body mass index is 34.17 kg/m?Marland Kitchen     Physical Exam  Vitals and nursing note reviewed.   Constitutional:       Appearance: Normal appearance.   HENT:      Head: Normocephalic and atraumatic.   Pulmonary:      Effort: Pulmonary effort is normal.   Musculoskeletal:      Cervical back: No tenderness.      Thoracic back: No tenderness.      Lumbar back: No tenderness.   Neurological:      General: No focal deficit present.      Mental Status: He is alert and oriented to person, place, and time.      Sensory: Sensation is intact.      Motor: Motor function is intact.      Gait: Gait abnormal (Antalgic gait).      Deep Tendon Reflexes: Reflexes are normal and symmetric.      Comments: Tinel's positive left elbow   Psychiatric:         Mood and Affect: Mood normal.         Behavior: Behavior normal.         Thought Content: Thought content normal.  Judgment: Judgment normal.     Independently reviewed recent MRIs of the cervical and lumbar spine.  MRI of the cervical spine demonstrates multilevel degenerative changes with moderate stenosis at C3-4 and severe stenosis at C4-5.  MRI of the lumbar spine demonstrates multilevel degenerative changes with evidence of prior hemilaminotomy on the left at L4-5 and severe central canal stenosis at L3-4.         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  I had a long discussion with the patient regarding natural history of his condition and recommendations.  Given that his cervical stenosis is asymptomatic I am not recommending any intervention at this time.  For his lumbar stenosis I am recommending minimally invasive L3 laminectomy.  Surgical procedure, risk, benefits, alternatives were discussed with the patient in detail.  He stated that he was going to think about it and talk it over with his wife but that likely he would want to get something done in the spring.

## 2020-12-15 ENCOUNTER — Encounter: Admit: 2020-12-15 | Discharge: 2020-12-15 | Payer: MEDICARE

## 2020-12-15 NOTE — Telephone Encounter
Patient LVM stating that he saw er. Davis with neurosurgery and would like to discuss the plan with Dr. Marijean Bravo.        This RN LVM for patient stating that Dr. Marijean Bravo was out of the office and will return his call once she returns.

## 2020-12-15 NOTE — Telephone Encounter
I reviewed the notes, and it would seem reasonable to start with the lower back due to symptoms there. The neck will likely need to be addressed at some time though.

## 2021-03-10 ENCOUNTER — Encounter: Admit: 2021-03-10 | Discharge: 2021-03-10 | Payer: MEDICARE

## 2021-03-10 NOTE — Telephone Encounter
Patient had called to set up surgery with Dr.Davis. Scheduled him for surgery June 11, 2021. Patient gave verbal understanding.

## 2021-03-11 ENCOUNTER — Ambulatory Visit: Admit: 2021-03-11 | Discharge: 2021-03-11 | Payer: MEDICARE

## 2021-03-11 ENCOUNTER — Encounter: Admit: 2021-03-11 | Discharge: 2021-03-11 | Payer: MEDICARE

## 2021-03-11 DIAGNOSIS — M48062 Spinal stenosis, lumbar region with neurogenic claudication: Secondary | ICD-10-CM

## 2021-03-11 MED ORDER — CEFAZOLIN IN 0.9% SOD CHLORIDE 2 GRAM/110 ML IVPB
2 g | Freq: Once | INTRAVENOUS | 0 refills
Start: 2021-03-11 — End: ?

## 2021-03-12 ENCOUNTER — Encounter: Admit: 2021-03-12 | Discharge: 2021-03-12 | Payer: MEDICARE

## 2021-05-04 ENCOUNTER — Encounter: Admit: 2021-05-04 | Discharge: 2021-05-04 | Payer: MEDICARE

## 2021-05-04 NOTE — Telephone Encounter
Spoke with patient about his request to add a transposition of ulnar nerve to his surgery. Dr. Rosana Hoes said that he would not be able to do that. Patient gave verbal understanding.

## 2021-05-18 ENCOUNTER — Encounter: Admit: 2021-05-18 | Discharge: 2021-05-18 | Payer: MEDICARE

## 2021-05-19 ENCOUNTER — Encounter: Admit: 2021-05-19 | Discharge: 2021-05-19 | Payer: MEDICARE

## 2021-05-19 DIAGNOSIS — R0609 Other forms of dyspnea: Secondary | ICD-10-CM

## 2021-05-19 DIAGNOSIS — Z0389 Encounter for observation for other suspected diseases and conditions ruled out: Secondary | ICD-10-CM

## 2021-05-19 DIAGNOSIS — N429 Disorder of prostate, unspecified: Secondary | ICD-10-CM

## 2021-05-19 DIAGNOSIS — IMO0002 Ulcer: Secondary | ICD-10-CM

## 2021-05-19 DIAGNOSIS — K219 Gastro-esophageal reflux disease without esophagitis: Secondary | ICD-10-CM

## 2021-05-19 DIAGNOSIS — E785 Hyperlipidemia, unspecified: Secondary | ICD-10-CM

## 2021-05-19 DIAGNOSIS — N189 Chronic kidney disease, unspecified: Secondary | ICD-10-CM

## 2021-05-19 DIAGNOSIS — R12 Heartburn: Secondary | ICD-10-CM

## 2021-05-19 DIAGNOSIS — I00 Rheumatic fever without heart involvement: Secondary | ICD-10-CM

## 2021-05-31 ENCOUNTER — Encounter: Admit: 2021-05-31 | Discharge: 2021-05-31 | Payer: MEDICARE

## 2021-05-31 NOTE — Telephone Encounter
Spoke with patient's wife Neoma Laming about where for patient to send Xray information and physician clearance. I gave her my fax number to send the information.

## 2021-06-11 ENCOUNTER — Ambulatory Visit: Admit: 2021-06-11 | Discharge: 2021-06-11 | Payer: MEDICARE

## 2021-06-11 ENCOUNTER — Encounter: Admit: 2021-06-11 | Discharge: 2021-06-11 | Payer: MEDICARE

## 2021-06-11 DIAGNOSIS — R12 Heartburn: Secondary | ICD-10-CM

## 2021-06-11 DIAGNOSIS — R0609 Other forms of dyspnea: Secondary | ICD-10-CM

## 2021-06-11 DIAGNOSIS — Z0389 Encounter for observation for other suspected diseases and conditions ruled out: Secondary | ICD-10-CM

## 2021-06-11 DIAGNOSIS — N189 Chronic kidney disease, unspecified: Secondary | ICD-10-CM

## 2021-06-11 DIAGNOSIS — IMO0002 Ulcer: Secondary | ICD-10-CM

## 2021-06-11 DIAGNOSIS — N429 Disorder of prostate, unspecified: Secondary | ICD-10-CM

## 2021-06-11 DIAGNOSIS — K219 Gastro-esophageal reflux disease without esophagitis: Secondary | ICD-10-CM

## 2021-06-11 DIAGNOSIS — I00 Rheumatic fever without heart involvement: Secondary | ICD-10-CM

## 2021-06-11 DIAGNOSIS — E785 Hyperlipidemia, unspecified: Secondary | ICD-10-CM

## 2021-06-11 MED ORDER — ONDANSETRON HCL (PF) 4 MG/2 ML IJ SOLN
INTRAVENOUS | 0 refills | Status: DC
Start: 2021-06-11 — End: 2021-06-11
  Administered 2021-06-11: 15:00:00 4 mg via INTRAVENOUS

## 2021-06-11 MED ORDER — DEXAMETHASONE SODIUM PHOSPHATE 4 MG/ML IJ SOLN
INTRAVENOUS | 0 refills | Status: DC
Start: 2021-06-11 — End: 2021-06-11
  Administered 2021-06-11: 15:00:00 4 mg via INTRAVENOUS

## 2021-06-11 MED ORDER — PHENYLEPHRINE HCL IN 0.9% NACL 1 MG/10 ML (100 MCG/ML) IV SYRG
INTRAVENOUS | 0 refills | Status: DC
Start: 2021-06-11 — End: 2021-06-11
  Administered 2021-06-11: 15:00:00 150 ug via INTRAVENOUS
  Administered 2021-06-11: 15:00:00 100 ug via INTRAVENOUS
  Administered 2021-06-11 (×2): 200 ug via INTRAVENOUS

## 2021-06-11 MED ORDER — MIDAZOLAM 1 MG/ML IJ SOLN
INTRAVENOUS | 0 refills | Status: DC
Start: 2021-06-11 — End: 2021-06-11
  Administered 2021-06-11: 15:00:00 2 mg via INTRAVENOUS

## 2021-06-11 MED ORDER — FENTANYL CITRATE (PF) 50 MCG/ML IJ SOLN
INTRAVENOUS | 0 refills | Status: DC
Start: 2021-06-11 — End: 2021-06-11
  Administered 2021-06-11 (×2): 50 ug via INTRAVENOUS

## 2021-06-11 MED ORDER — ARTIFICIAL TEARS (PF) SINGLE DOSE DROPS GROUP
OPHTHALMIC | 0 refills | Status: DC
Start: 2021-06-11 — End: 2021-06-11
  Administered 2021-06-11: 15:00:00 2 [drp] via OPHTHALMIC

## 2021-06-11 MED ORDER — PROPOFOL INJ 10 MG/ML IV VIAL
INTRAVENOUS | 0 refills | Status: DC
Start: 2021-06-11 — End: 2021-06-11
  Administered 2021-06-11: 15:00:00 200 mg via INTRAVENOUS

## 2021-06-11 MED ORDER — SUCCINYLCHOLINE CHLORIDE 20 MG/ML IJ SOLN
INTRAVENOUS | 0 refills | Status: DC
Start: 2021-06-11 — End: 2021-06-11
  Administered 2021-06-11: 15:00:00 100 mg via INTRAVENOUS

## 2021-06-11 MED ORDER — EPHEDRINE SULFATE 50 MG/ML IV SOLN
INTRAVENOUS | 0 refills | Status: DC
Start: 2021-06-11 — End: 2021-06-11
  Administered 2021-06-11: 15:00:00 15 mg via INTRAVENOUS
  Administered 2021-06-11: 15:00:00 10 mg via INTRAVENOUS
  Administered 2021-06-11: 15:00:00 5 mg via INTRAVENOUS

## 2021-06-11 MED ORDER — LIDOCAINE (PF) 200 MG/10 ML (2 %) IJ SYRG
INTRAVENOUS | 0 refills | Status: DC
Start: 2021-06-11 — End: 2021-06-11
  Administered 2021-06-11: 15:00:00 80 mg via INTRAVENOUS

## 2021-06-11 MED ADMIN — CEFAZOLIN 1 GRAM IJ SOLR [1445]: 1000 mL | @ 15:00:00 | Stop: 2021-06-11 | NDC 60505614200

## 2021-06-11 MED ADMIN — CEFAZOLIN IN 0.9% SOD CHLORIDE 2 GRAM/110 ML IVPB [213015]: 2 g | INTRAVENOUS | @ 15:00:00 | Stop: 2021-06-11 | NDC 54029373309

## 2021-06-11 MED ADMIN — LACTATED RINGERS IV SOLP [4318]: 1000 mL | INTRAVENOUS | @ 14:00:00 | Stop: 2021-06-11 | NDC 00338011704

## 2021-06-11 MED ADMIN — TRIAMCINOLONE ACETONIDE 40 MG/ML IJ SUSP [80370]: 40 mg | TOPICAL | @ 15:00:00 | Stop: 2021-06-11 | NDC 00003029305

## 2021-06-11 MED ADMIN — THROMBIN (BOVINE) 5,000 UNIT TP SOLR [164515]: 5000 [IU] | TOPICAL | @ 15:00:00 | Stop: 2021-06-11 | NDC 60793021505

## 2021-06-11 MED ADMIN — OXYCODONE 5 MG PO TAB [10814]: 5 mg | ORAL | @ 16:00:00 | Stop: 2021-06-11 | NDC 68084035411

## 2021-06-11 MED ADMIN — BUPIVACAINE-EPINEPHRINE 0.5 %-1:200,000 IJ SOLN [14984]: 3.5 mL | INTRAMUSCULAR | @ 15:00:00 | Stop: 2021-06-11 | NDC 63323046357

## 2021-06-11 MED ADMIN — SODIUM CHLORIDE 0.9 % IV SOLP [27838]: 1000 mL | @ 15:00:00 | Stop: 2021-06-11 | NDC 00338004904

## 2021-06-11 MED ADMIN — ACETAMINOPHEN 500 MG PO TAB [102]: 1000 mg | ORAL | @ 14:00:00 | Stop: 2021-06-11 | NDC 00904673061

## 2021-06-13 ENCOUNTER — Encounter: Admit: 2021-06-13 | Discharge: 2021-06-13 | Payer: MEDICARE

## 2021-06-13 DIAGNOSIS — N429 Disorder of prostate, unspecified: Secondary | ICD-10-CM

## 2021-06-13 DIAGNOSIS — N189 Chronic kidney disease, unspecified: Secondary | ICD-10-CM

## 2021-06-13 DIAGNOSIS — IMO0002 Ulcer: Secondary | ICD-10-CM

## 2021-06-13 DIAGNOSIS — R12 Heartburn: Secondary | ICD-10-CM

## 2021-06-13 DIAGNOSIS — I00 Rheumatic fever without heart involvement: Secondary | ICD-10-CM

## 2021-06-13 DIAGNOSIS — Z0389 Encounter for observation for other suspected diseases and conditions ruled out: Secondary | ICD-10-CM

## 2021-06-13 DIAGNOSIS — R0609 Other forms of dyspnea: Secondary | ICD-10-CM

## 2021-06-13 DIAGNOSIS — E785 Hyperlipidemia, unspecified: Secondary | ICD-10-CM

## 2021-06-13 DIAGNOSIS — K219 Gastro-esophageal reflux disease without esophagitis: Secondary | ICD-10-CM

## 2021-06-21 ENCOUNTER — Ambulatory Visit: Admit: 2021-06-21 | Discharge: 2021-06-21 | Payer: MEDICARE

## 2021-06-21 ENCOUNTER — Encounter: Admit: 2021-06-21 | Discharge: 2021-06-21 | Payer: MEDICARE

## 2021-06-21 DIAGNOSIS — R0609 Other forms of dyspnea: Secondary | ICD-10-CM

## 2021-06-21 DIAGNOSIS — Z9889 Other specified postprocedural states: Secondary | ICD-10-CM

## 2021-06-21 DIAGNOSIS — N429 Disorder of prostate, unspecified: Secondary | ICD-10-CM

## 2021-06-21 DIAGNOSIS — R12 Heartburn: Secondary | ICD-10-CM

## 2021-06-21 DIAGNOSIS — N189 Chronic kidney disease, unspecified: Secondary | ICD-10-CM

## 2021-06-21 DIAGNOSIS — K219 Gastro-esophageal reflux disease without esophagitis: Secondary | ICD-10-CM

## 2021-06-21 DIAGNOSIS — I00 Rheumatic fever without heart involvement: Secondary | ICD-10-CM

## 2021-06-21 DIAGNOSIS — IMO0002 Ulcer: Secondary | ICD-10-CM

## 2021-06-21 DIAGNOSIS — Z0389 Encounter for observation for other suspected diseases and conditions ruled out: Secondary | ICD-10-CM

## 2021-06-21 DIAGNOSIS — E785 Hyperlipidemia, unspecified: Secondary | ICD-10-CM

## 2021-06-21 NOTE — Progress Notes
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  He presents for follow-up of his MIS L3 laminectomy on 06/11/2021.  He states he is doing well and feels that he is better than he was prior to surgery.  He has no significant complaints today.             Objective:          acetaminophen SR (TYLENOL) 650 mg tablet Take two tablets by mouth daily.    docusate (COLACE) 100 mg capsule Take one capsule by mouth every 48 hours.    ERGOCALCIFEROL (VITAMIN D2) (VITAMIN D PO) Take 2,000 Units by mouth daily.    esomeprazole DR (NEXIUM) 20 mg capsule Take one capsule by mouth as Needed (1-2x every 3 weeks). Take on an empty stomach at least 1 hour before or 2 hours after food.    eszopiclone (LUNESTA) 3 mg tablet Take one tablet by mouth at bedtime as needed for Sleep.    glucosamine/chondr su A sod (OSTEO BI-FLEX PO) Take 2 tablets by mouth daily. Indications: per patient takes 2 tab per day    mirabegron (MYRBETRIQ) 50 mg ER tablet Take one tablet by mouth daily.    oxyCODONE (ROXICODONE) 5 mg tablet Take one tablet by mouth every 4 hours as needed for Pain.    tadalafiL (CIALIS) 20 mg tablet Take one-half tablet by mouth as Needed for Erectile dysfunction.     Vitals:    06/21/21 1342   BP: 132/79   BP Source: Arm, Right Upper   Pulse: 74   SpO2: 98%   PainSc: One   Weight: 111.1 kg (245 lb)     Body mass index is 34.17 kg/m.     Physical Exam  Awake, alert, and oriented x4  Grossly neurologically intact  Incision healing well       Assessment and Plan:     Patient is recovering appropriately from surgery.  I will see him back as previously scheduled or around on an as-needed basis.

## 2021-06-28 ENCOUNTER — Encounter: Admit: 2021-06-28 | Discharge: 2021-06-28 | Payer: MEDICARE

## 2021-06-28 DIAGNOSIS — K219 Gastro-esophageal reflux disease without esophagitis: Secondary | ICD-10-CM

## 2021-06-28 DIAGNOSIS — E785 Hyperlipidemia, unspecified: Secondary | ICD-10-CM

## 2021-06-28 DIAGNOSIS — R0609 Other forms of dyspnea: Secondary | ICD-10-CM

## 2021-06-28 DIAGNOSIS — N429 Disorder of prostate, unspecified: Secondary | ICD-10-CM

## 2021-06-28 DIAGNOSIS — R12 Heartburn: Secondary | ICD-10-CM

## 2021-06-28 DIAGNOSIS — Z0389 Encounter for observation for other suspected diseases and conditions ruled out: Secondary | ICD-10-CM

## 2021-06-28 DIAGNOSIS — IMO0002 Ulcer: Secondary | ICD-10-CM

## 2021-06-28 DIAGNOSIS — N189 Chronic kidney disease, unspecified: Secondary | ICD-10-CM

## 2021-06-28 DIAGNOSIS — I00 Rheumatic fever without heart involvement: Secondary | ICD-10-CM

## 2021-07-05 ENCOUNTER — Encounter: Admit: 2021-07-05 | Discharge: 2021-07-05 | Payer: MEDICARE

## 2021-07-05 NOTE — Telephone Encounter
Received incoming VM from patient stating he is due for his 3 year interval colonoscopy with Dr. Glennon Mac. Knows we schedule out a ways, and is currently looking to schedule for late summer or fall. Also needs to schedule an appointment with Dr. Glennon Mac to discuss his GI health.   Outbound call to pt. Pt agreeable to in person appt with Dr. Glennon Mac on 11/15/2021 at 10:00AM. Reminder set to place order for repeat colonoscopy as it gets closer.

## 2021-07-13 ENCOUNTER — Encounter: Admit: 2021-07-13 | Discharge: 2021-07-13 | Payer: MEDICARE

## 2021-07-13 IMAGING — CR KNEELMRT
1 series · 1 of 1 positions shown · non-contrast
Comparison: none

[x knee ap right]
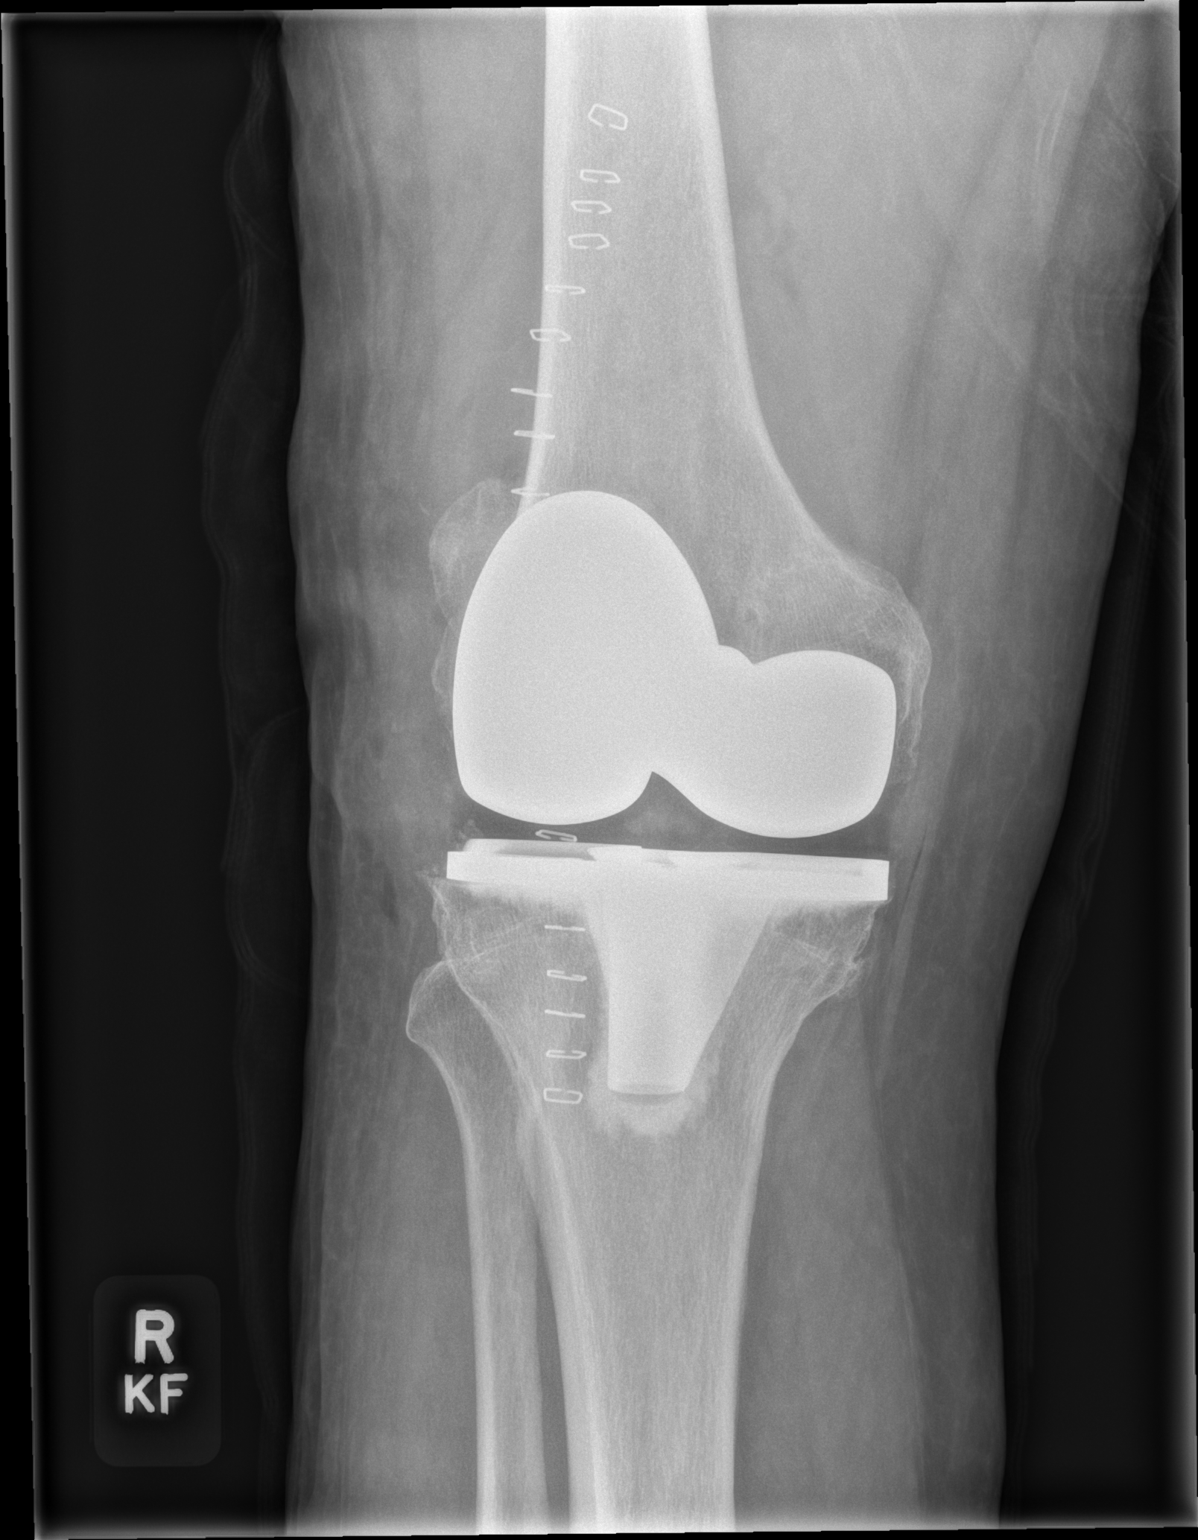

[1 of 1 positions shown; findings below may reference images not displayed]

DIAGNOSTIC STUDIES

EXAM

XR knee RT 2V

INDICATION

s/p R TKA
RT TKA POST OP

TECHNIQUE

AP and lateral views

COMPARISONS

September 03, 2020

FINDINGS

Right knee prosthesis is in place demonstrating anatomic alignment. Skin staples and postop changes
of the overlying soft tissues are evident. No fractures are seen.

IMPRESSION

Right knee prosthesis in place.

Tech Notes:

RT TKA POST OP

## 2021-07-19 ENCOUNTER — Encounter: Admit: 2021-07-19 | Discharge: 2021-07-19 | Payer: MEDICARE

## 2021-07-19 ENCOUNTER — Ambulatory Visit: Admit: 2021-07-19 | Discharge: 2021-07-19 | Payer: MEDICARE

## 2021-07-19 DIAGNOSIS — N189 Chronic kidney disease, unspecified: Secondary | ICD-10-CM

## 2021-07-19 DIAGNOSIS — N429 Disorder of prostate, unspecified: Secondary | ICD-10-CM

## 2021-07-19 DIAGNOSIS — E785 Hyperlipidemia, unspecified: Secondary | ICD-10-CM

## 2021-07-19 DIAGNOSIS — R0609 Other forms of dyspnea: Secondary | ICD-10-CM

## 2021-07-19 DIAGNOSIS — Z9889 Other specified postprocedural states: Secondary | ICD-10-CM

## 2021-07-19 DIAGNOSIS — K219 Gastro-esophageal reflux disease without esophagitis: Secondary | ICD-10-CM

## 2021-07-19 DIAGNOSIS — I00 Rheumatic fever without heart involvement: Secondary | ICD-10-CM

## 2021-07-19 DIAGNOSIS — IMO0002 Ulcer: Secondary | ICD-10-CM

## 2021-07-19 DIAGNOSIS — R12 Heartburn: Secondary | ICD-10-CM

## 2021-07-19 DIAGNOSIS — M2550 Pain in unspecified joint: Secondary | ICD-10-CM

## 2021-07-19 DIAGNOSIS — Z0389 Encounter for observation for other suspected diseases and conditions ruled out: Secondary | ICD-10-CM

## 2021-07-19 NOTE — Progress Notes
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  He presents for follow-up of his MIS L3 laminectomy on 06/11/2021.  He is doing quite well and states that his preoperative neurogenic claudication symptoms have completely resolved.  He is very pleased with the results of his surgery so far.  He has no significant complaints today.             Objective:         ? acetaminophen SR (TYLENOL) 650 mg tablet Take two tablets by mouth twice daily.   ? aspirin EC 81 mg tablet Take one tablet by mouth twice daily. Take with food.   ? docusate (COLACE) 100 mg capsule Take one capsule by mouth twice daily.   ? dutasteride (AVODART) 0.5 mg capsule Take one capsule by mouth daily.   ? ERGOCALCIFEROL (VITAMIN D2) (VITAMIN D PO) Take 2,000 Units by mouth daily.   ? esomeprazole DR (NEXIUM) 20 mg capsule Take one capsule by mouth as Needed (1-2x every 3 weeks). Take on an empty stomach at least 1 hour before or 2 hours after food.   ? eszopiclone (LUNESTA) 3 mg tablet Take one tablet by mouth at bedtime as needed for Sleep.   ? glucosamine/chondr su A sod (OSTEO BI-FLEX PO) Take 2 tablets by mouth daily. Indications: per patient takes 2 tab per day   ? mirabegron [facility]) 25 mg tablet Take one tablet by mouth daily.   ? oxyCODONE (ROXICODONE) 5 mg tablet Take one tablet by mouth every 4 hours as needed for Pain.   ? tadalafiL (CIALIS) 20 mg tablet Take one-half tablet by mouth as Needed for Erectile dysfunction.     Vitals:    07/19/21 1131   BP: (!) 154/85   BP Source: Arm, Right Upper   Pulse: 84   Temp: 36.7 ?C (98 ?F)   SpO2: 98%   TempSrc: Temporal   PainSc: Zero   Weight: 113.4 kg (250 lb)   Height: 180.3 cm (5' 11)     Body mass index is 34.87 kg/m?Marland Kitchen     Physical Exam  Awake, alert, oriented x4  Grossly neurologically intact  Incision healing well       Assessment and Plan:     Patient is recovering appropriately from surgery.  At this time I will see him back on an as-needed basis.

## 2021-07-26 IMAGING — CR [ID]
3 series · 3 of 3 positions shown · non-contrast
Comparison: none

[knee ap]
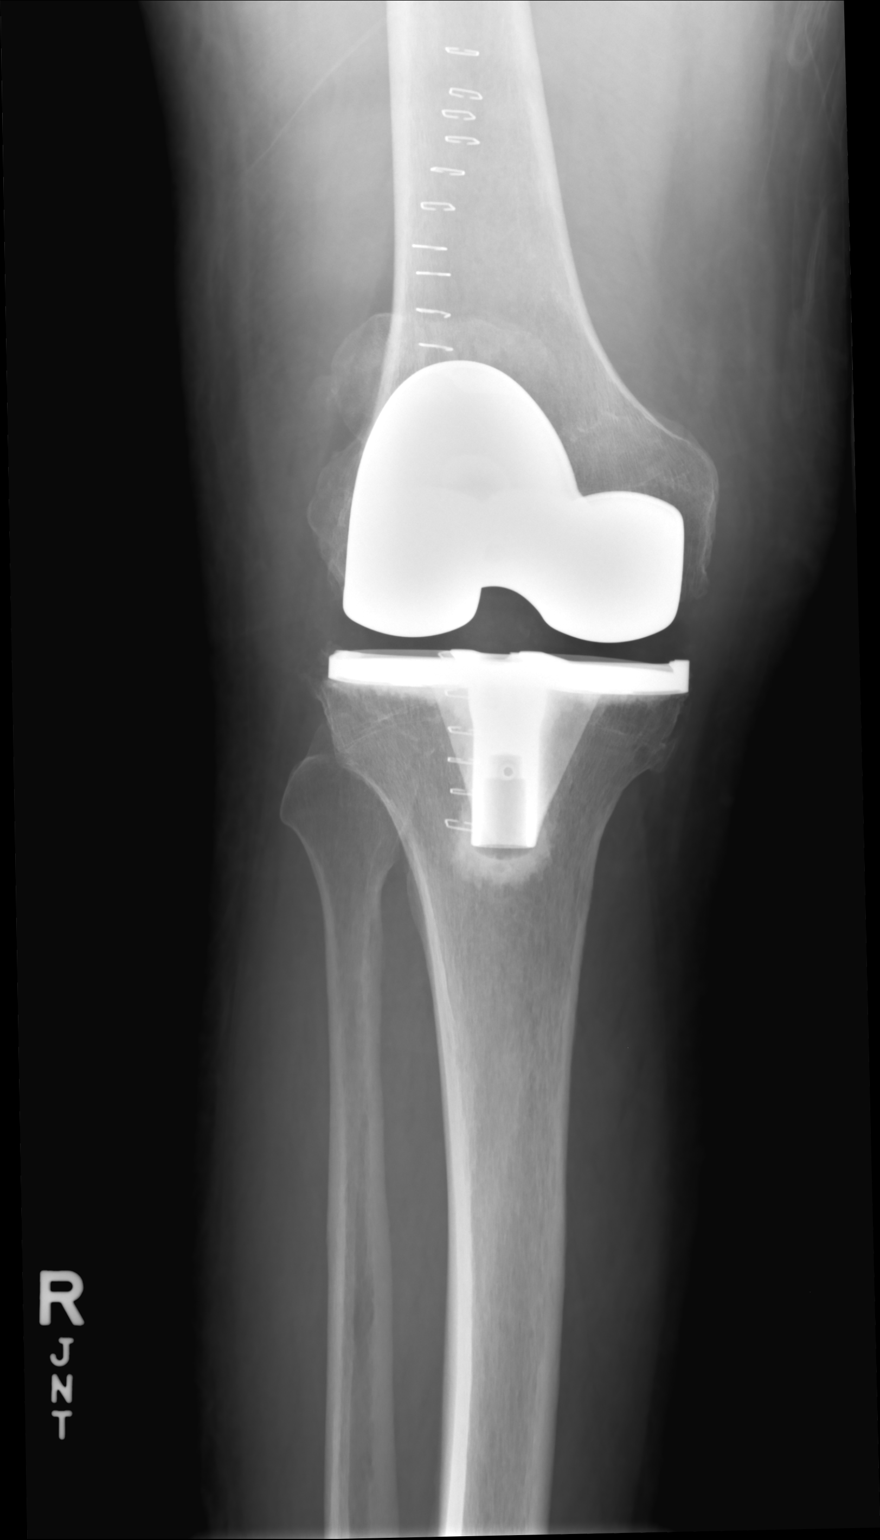

[knee sunrise]
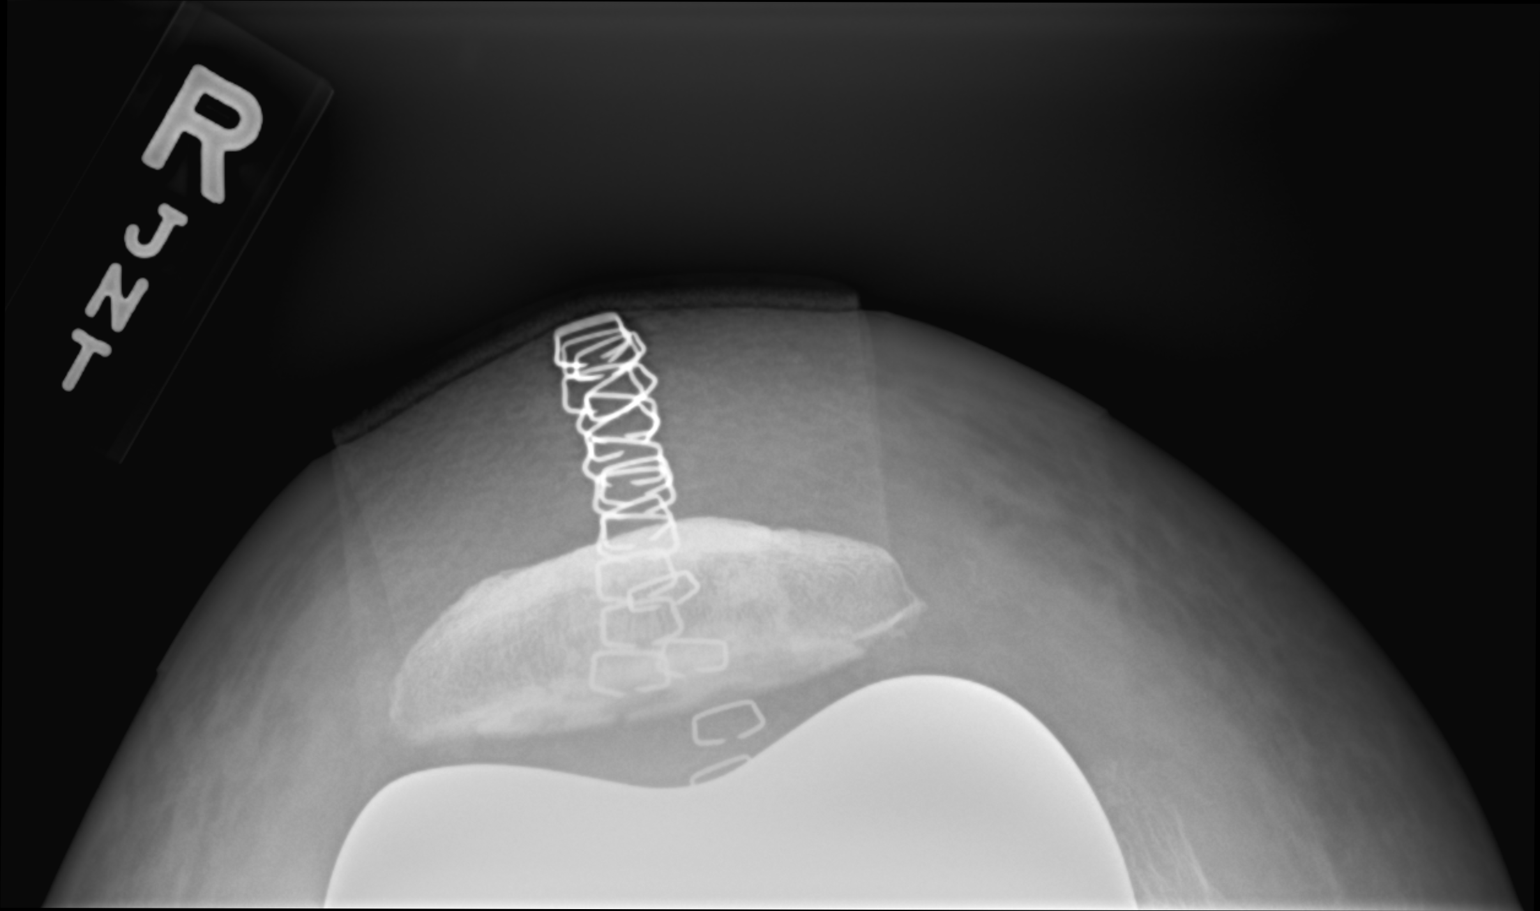

[knee lat]
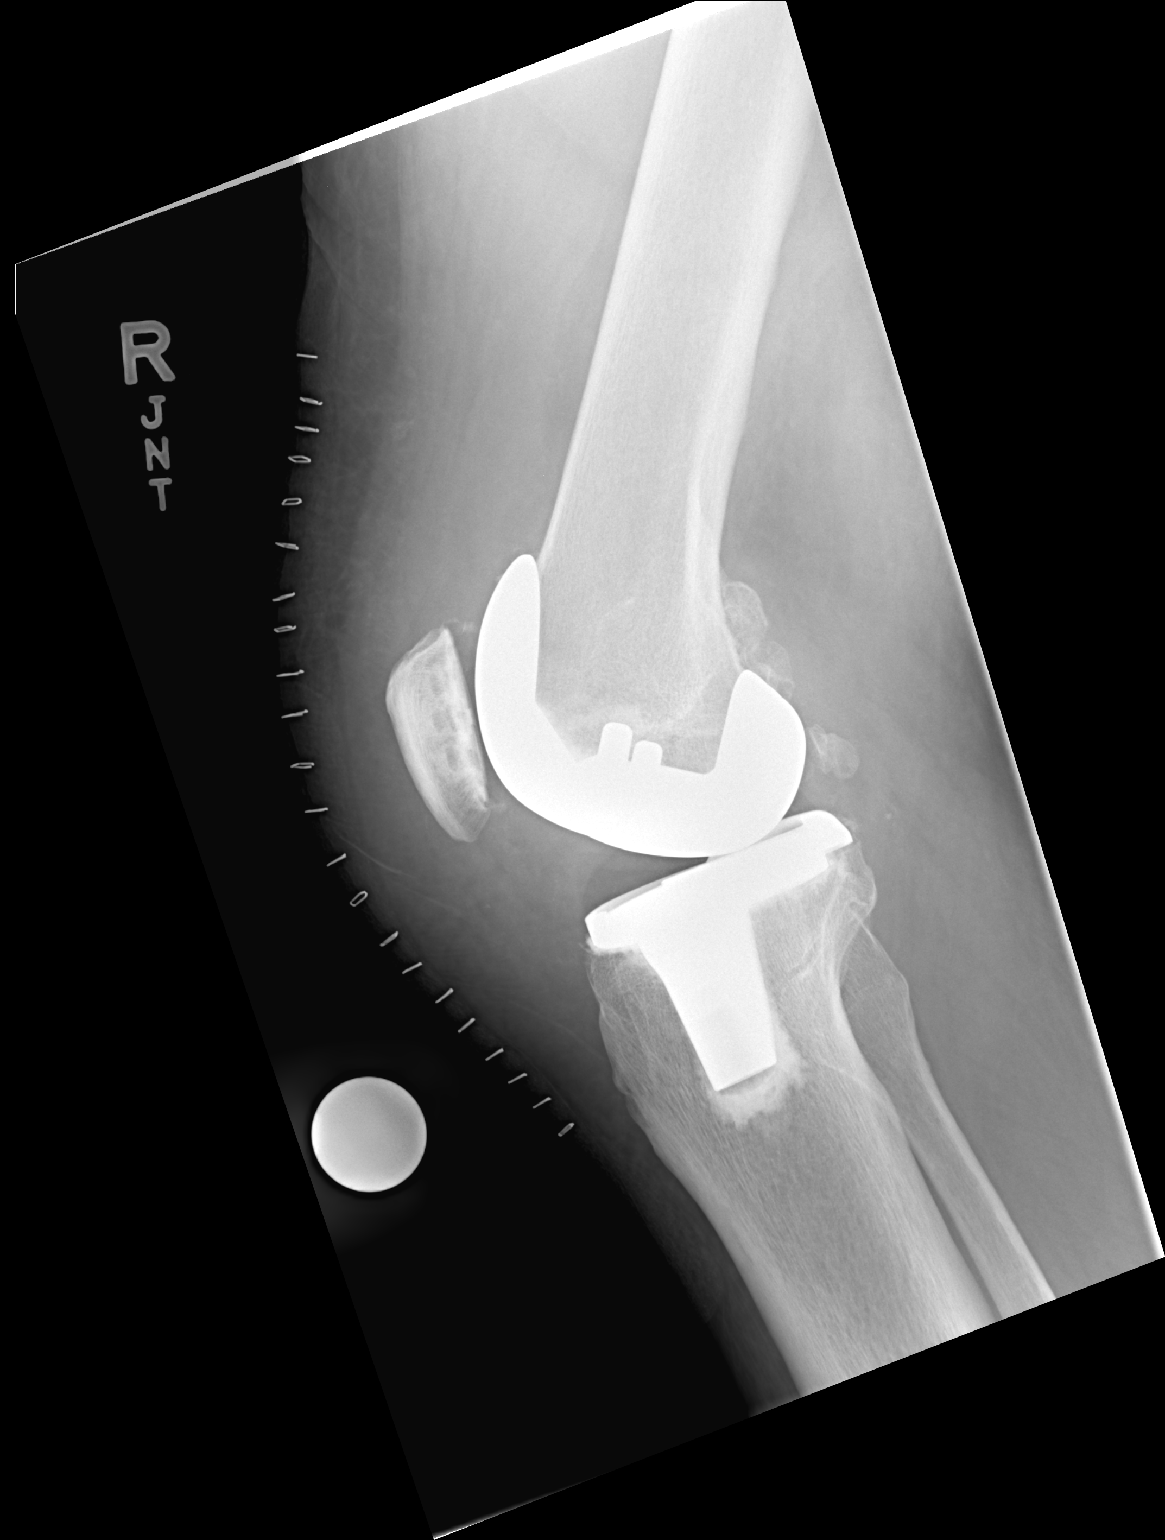

[3 of 3 positions shown; findings below may reference images not displayed]

DIAGNOSTIC STUDIES

EXAM

XR knee RT 3V

INDICATION

p/o tka
2 WEEKS POST OP

TECHNIQUE

AP lateral patellar views

COMPARISONS

July 13, 2021

FINDINGS

Right knee prosthesis is in place demonstrating anatomic alignment. No fractures are seen. Knee
effusion soft tissue swelling is noted consistent with recent surgery. Symptoms of infection should
be excluded clinically.

IMPRESSION

Right knee prosthesis in anatomic alignment.

Tech Notes:

2 WEEKS POST OP

## 2021-09-22 ENCOUNTER — Encounter: Admit: 2021-09-22 | Discharge: 2021-09-22 | Payer: MEDICARE

## 2021-09-23 ENCOUNTER — Encounter: Admit: 2021-09-23 | Discharge: 2021-09-23 | Payer: MEDICARE

## 2021-09-23 ENCOUNTER — Ambulatory Visit: Admit: 2021-09-23 | Discharge: 2021-09-23 | Payer: MEDICARE

## 2021-09-23 DIAGNOSIS — D472 Monoclonal gammopathy: Secondary | ICD-10-CM

## 2021-09-23 DIAGNOSIS — Z86010 Personal history of colonic polyps: Secondary | ICD-10-CM

## 2021-09-24 ENCOUNTER — Encounter: Admit: 2021-09-24 | Discharge: 2021-09-24 | Payer: MEDICARE

## 2021-09-24 DIAGNOSIS — D472 Monoclonal gammopathy: Secondary | ICD-10-CM

## 2021-09-24 DIAGNOSIS — E785 Hyperlipidemia, unspecified: Secondary | ICD-10-CM

## 2021-09-24 DIAGNOSIS — K219 Gastro-esophageal reflux disease without esophagitis: Secondary | ICD-10-CM

## 2021-09-24 DIAGNOSIS — N429 Disorder of prostate, unspecified: Secondary | ICD-10-CM

## 2021-09-24 DIAGNOSIS — IMO0002 Ulcer: Secondary | ICD-10-CM

## 2021-09-24 DIAGNOSIS — R12 Heartburn: Secondary | ICD-10-CM

## 2021-09-24 DIAGNOSIS — Z0389 Encounter for observation for other suspected diseases and conditions ruled out: Secondary | ICD-10-CM

## 2021-09-24 DIAGNOSIS — N189 Chronic kidney disease, unspecified: Secondary | ICD-10-CM

## 2021-09-24 DIAGNOSIS — M2550 Pain in unspecified joint: Secondary | ICD-10-CM

## 2021-09-24 DIAGNOSIS — I00 Rheumatic fever without heart involvement: Secondary | ICD-10-CM

## 2021-09-24 DIAGNOSIS — R0609 Other forms of dyspnea: Secondary | ICD-10-CM

## 2021-09-24 NOTE — Progress Notes
name: Joe Whitehead  Joe Whitehead          MRN: [id number]      DOB: 1951/02/14      AGE: 71 y.o.   DATE OF SERVICE: 09/24/2021    Subjective:             Reason for Visit:  Heme/Onc Care      Joe Whitehead  Joe Whitehead is a 71 y.o. male.         History of Present Illness  Joe Whitehead is a 71 y.o. pleasant male seen at kind request of Joe Whitehead.  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  Labs performed including SPEP.  This revealed paraprotein with M spike of 0.34 leading to hematology consultation..    Labs from July 2022 show M spike of 0.34 on serum protein electrophoresis.  Immunofixation consistent with IgG kappa.  Creatinine normal  at 1.21.  Hemoglobin 15.8 with hematocrit of 50.  Mild increase in neutrophils.  B12 and folate normal.  Labs performed through Joe Whitehead 09/10/20 showed normal immunoglobulins and normal light chains.  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  He is unaccompanied for today's visit.  Multiple orthopedic issues since his last visit with me status post surgery.  Contemplating epidural injections for the back.         Review of Systems   Constitutional: Negative for chills, fatigue and fever.   Respiratory: Negative for cough, shortness of breath and wheezing.    Cardiovascular: Negative for chest pain and palpitations.   Gastrointestinal: Negative for abdominal pain, diarrhea, nausea and vomiting.   Musculoskeletal: Positive for back pain.   Neurological: Positive for numbness. Negative for dizziness and light-headedness.         Objective:         ? acetaminophen SR (TYLENOL) 650 mg tablet Take two tablets by mouth twice daily.   ? aspirin EC 81 mg tablet Take one tablet by mouth twice daily. Take with food.   ? docusate (COLACE) 100 mg capsule Take one capsule by mouth twice daily.   ? dutasteride (AVODART) 0.5 mg capsule Take one capsule by mouth daily.   ? ERGOCALCIFEROL (VITAMIN D2) (VITAMIN D PO) Take 2,000 Units by mouth daily.   ? esomeprazole DR (NEXIUM) 20 mg capsule Take one capsule by mouth as Needed (1-2x every 3 weeks). Take on an empty stomach at least 1 hour before or 2 hours after food.   ? eszopiclone (LUNESTA) 3 mg tablet Take one tablet by mouth at bedtime as needed for Sleep.   ? glucosamine/chondr su A sod (OSTEO BI-FLEX PO) Take 2 tablets by mouth daily. Indications: per patient takes 2 tab per day   ? mirabegron (MYRBETRIQ) 50 mg ER tablet Take 25 mg by mouth daily.   ? oxyCODONE (ROXICODONE) 5 mg tablet Take one tablet by mouth every 4 hours as needed for Pain.   ? peg 3350-electrolytes (GOLYTELY) 236-22.74-6.74 -5.86 gram oral solution Mix as directed on package. Drink as directed by the GI clinic. Refrigerate once mixed.   ? tadalafiL (CIALIS) 20 mg tablet Take one-half tablet by mouth as Needed for Erectile dysfunction.     Vitals:    09/24/21 1435   BP: (!) 144/73   BP Source: Arm, Right Upper   Pulse: 75   Temp: 36.7 ?C (98.1 ?F)   Resp: 18   SpO2:  97%   TempSrc: Temporal   PainSc: Two   Weight: 113.6 kg (250 lb 6.4 oz)     Body mass index is 34.92 kg/m?Marland Kitchen     Pain Score: Two  Pain Loc: Back (lower right)    Fatigue Scale: 2    Pain Addressed:  Current regimen working to control pain.    Patient Evaluated for a Clinical Trial: Patient not eligible for a treatment trial (including not needing treatment, needs palliative care, in remission).     [place] Co[employer] Oncology Group performance status is 1, Restricted in physically strenuous activity but ambulatory and able to carry out work of a light or sedentary nature, e.g., light house work, office work.     Physical Exam  Constitutional:       General: He is not in acute distress.  HENT:      Head: Normocephalic.   Eyes:      General: No scleral icterus.     Extraocular Movements: Extraocular movements intact.   Neurological:      Mental Status: He is alert and oriented to person, place, and time.   Psychiatric:         Mood and Affect: Mood normal.         Behavior: Behavior normal.         Thought Content: Thought content normal.         Judgment: Judgment normal.          09/10/2020: IgG 1054, IgA 175, IgM 107  Kappa 14.1, lambda 12.3, ratio 1.15.    09/23/2021:  IgG 1020, IgM 97, IgA 166  SPEP with M spike of 0.2 , 0.4, there were 2 bands.  No immunofixation available  Kappa 21, lambda 17.5, ratio 1.20.  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  No anemia    July 2023: Recent labs reviewed.  IgA levels overall stable.  Mild increase in kappa light chain at 21 compared to previous labs.  SPEP with 2 small monoclonal bands.  No previous SPEP or immunofixation.  Hemoglobin normal.  Calcium normal.  Increase in creatinine to 1.36.  Joe Whitehead reports fluctuating creatinine as his baseline.    I have recommended repeat labs in 6 months with about changes.  Further frequency of lab testing and follow-up appointments based on those results.    Parts of this note were created with voice recognition software. Please excuse any grammatical or typographical errors.

## 2021-10-14 ENCOUNTER — Encounter: Admit: 2021-10-14 | Discharge: 2021-10-14 | Payer: MEDICARE

## 2021-10-14 ENCOUNTER — Ambulatory Visit: Admit: 2021-10-14 | Discharge: 2021-10-14 | Payer: MEDICARE

## 2021-10-14 DIAGNOSIS — R12 Heartburn: Secondary | ICD-10-CM

## 2021-10-14 DIAGNOSIS — R0609 Other forms of dyspnea: Secondary | ICD-10-CM

## 2021-10-14 DIAGNOSIS — M2550 Pain in unspecified joint: Secondary | ICD-10-CM

## 2021-10-14 DIAGNOSIS — M533 Sacrococcygeal disorders, not elsewhere classified: Secondary | ICD-10-CM

## 2021-10-14 DIAGNOSIS — N189 Chronic kidney disease, unspecified: Secondary | ICD-10-CM

## 2021-10-14 DIAGNOSIS — K219 Gastro-esophageal reflux disease without esophagitis: Secondary | ICD-10-CM

## 2021-10-14 DIAGNOSIS — E785 Hyperlipidemia, unspecified: Secondary | ICD-10-CM

## 2021-10-14 DIAGNOSIS — R29898 Other symptoms and signs involving the musculoskeletal system: Secondary | ICD-10-CM

## 2021-10-14 DIAGNOSIS — Z0389 Encounter for observation for other suspected diseases and conditions ruled out: Secondary | ICD-10-CM

## 2021-10-14 DIAGNOSIS — I00 Rheumatic fever without heart involvement: Secondary | ICD-10-CM

## 2021-10-14 DIAGNOSIS — M961 Postlaminectomy syndrome, not elsewhere classified: Secondary | ICD-10-CM

## 2021-10-14 DIAGNOSIS — M5416 Radiculopathy, lumbar region: Secondary | ICD-10-CM

## 2021-10-14 DIAGNOSIS — N429 Disorder of prostate, unspecified: Secondary | ICD-10-CM

## 2021-10-14 DIAGNOSIS — IMO0002 Ulcer: Secondary | ICD-10-CM

## 2021-10-26 IMAGING — MR SPLUMBWO
9 of 12 series · 30 of 48 positions shown · non-contrast
Comparison: none

[Series 6: T1 · sagittal · 4.0mm · 0.81mm/px · 2 of 18 slices shown (1 of 4)]
[im 1/18]
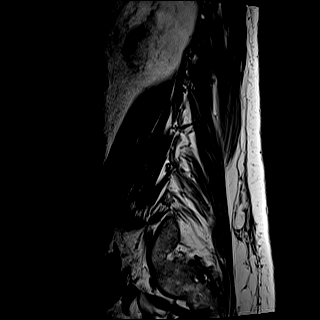
[im 18/18]
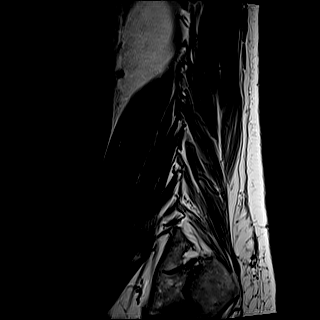

[Series 7: STIR · sagittal · 4.0mm · 0.51mm/px · 2 of 18 slices shown]
[im 1/18]
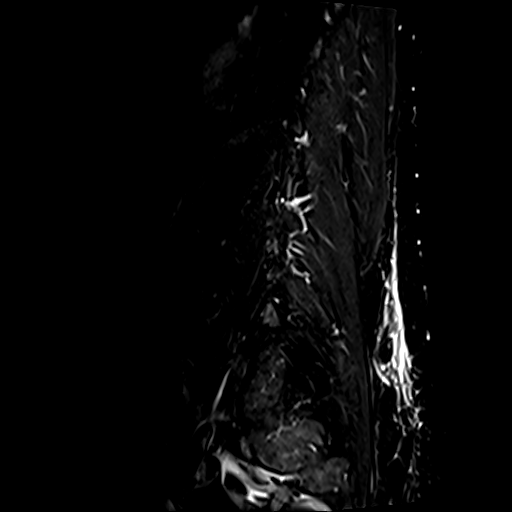
[im 18/18]
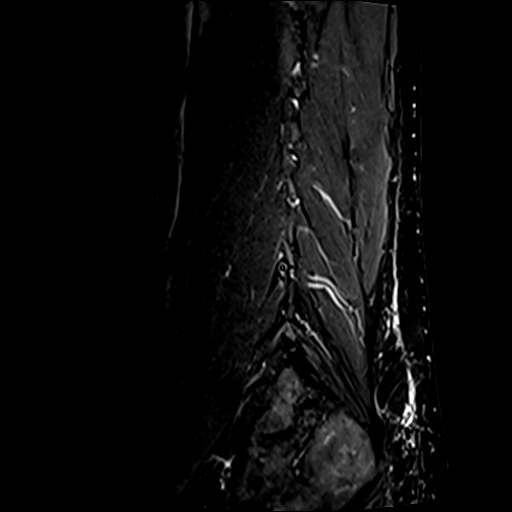

[Series 8: T2 · sagittal · 4.0mm · 0.68mm/px · 2 of 18 slices shown (1 of 4)]
[im 1/18]
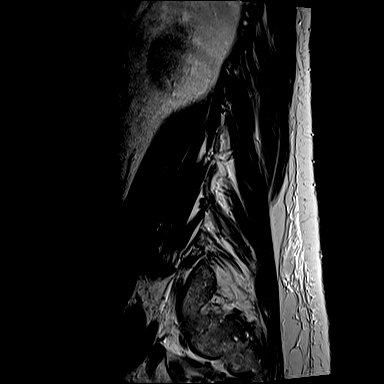
[im 18/18]
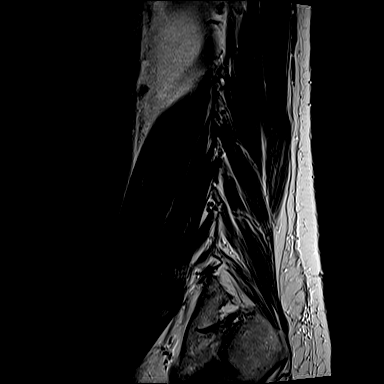

[Series 9: T2 · axial · 4.5mm · 0.81mm/px · z∈[+24,+181]mm · 4 of 30 slices shown (2 of 4)]
[im 1/30]
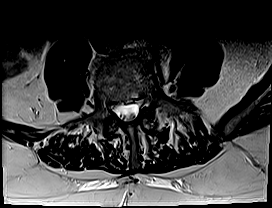
[im 10/30]
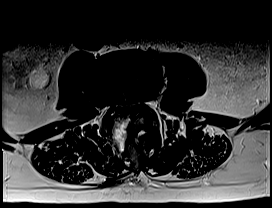
[im 20/30]
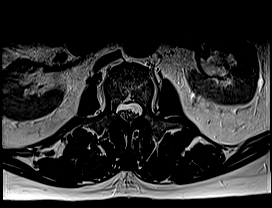
[im 30/30]
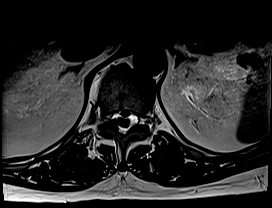

[Series 10: T2 · axial · 4.5mm · 0.81mm/px · z∈[-72,-22]mm · 2 of 12 slices shown (3 of 4)]
[im 1/12]
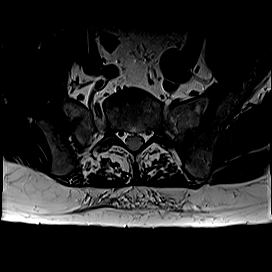
[im 12/12]
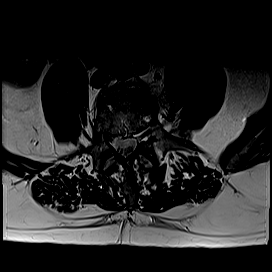

[Series 11: T2 · axial · 4.5mm · 0.81mm/px · z∈[-72,+181]mm · 6 of 42 slices shown (4 of 4)]
[im 1/42]
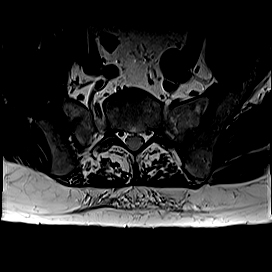
[im 9/42]
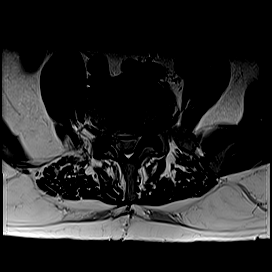
[im 17/42]
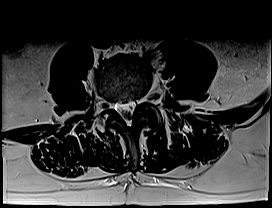
[im 25/42]
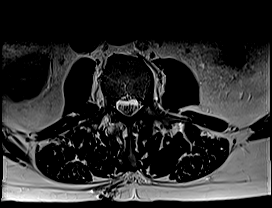
[im 33/42]
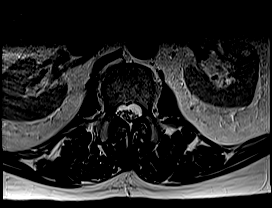
[im 42/42]
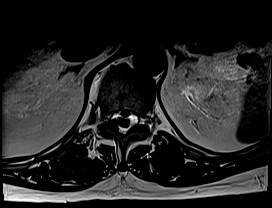

[Series 12: T1 · axial · 4.5mm · 0.43mm/px · z∈[+27,+183]mm · 4 of 30 slices shown (2 of 4)]
[im 1/30]
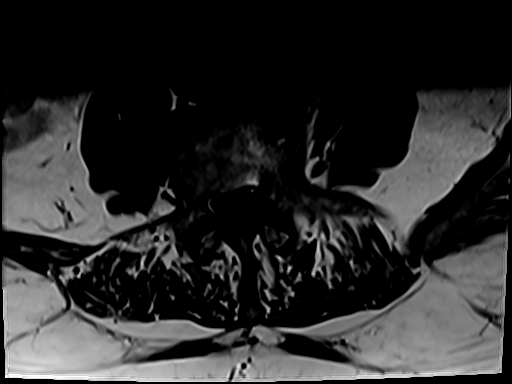
[im 10/30]
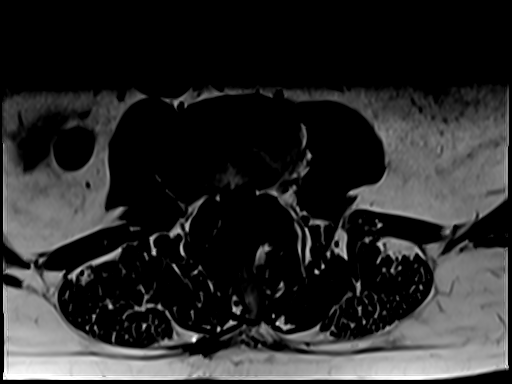
[im 20/30]
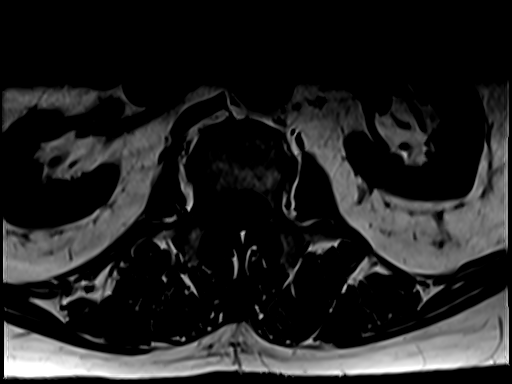
[im 30/30]
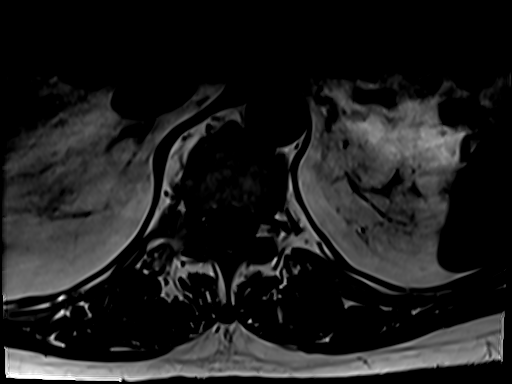

[Series 13: T1 · axial · 4.5mm · 0.43mm/px · z∈[-76,-26]mm · 2 of 12 slices shown (3 of 4)]
[im 1/12]
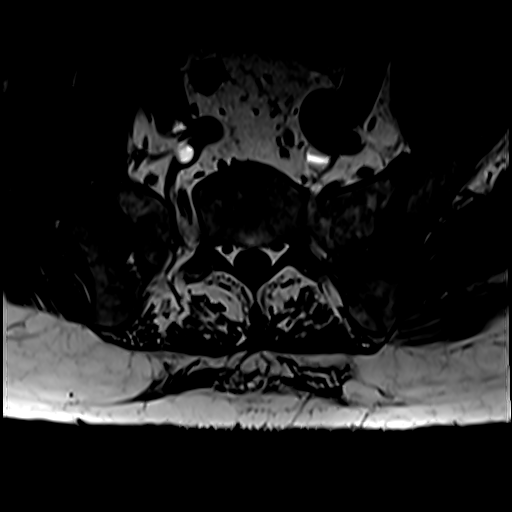
[im 12/12]
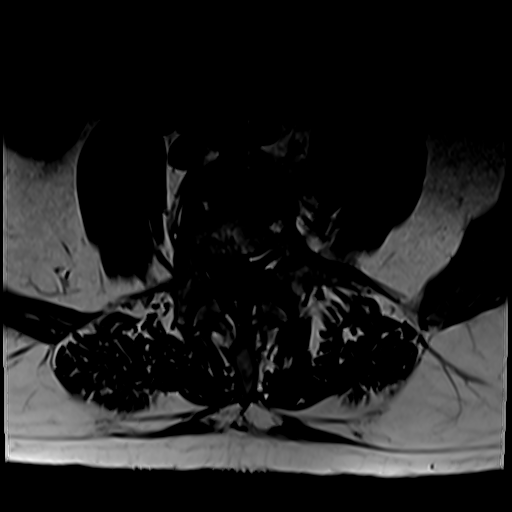

[Series 14: T1 · axial · 4.5mm · 0.43mm/px · z∈[-76,+183]mm · 6 of 42 slices shown (4 of 4)]
[im 1/42]
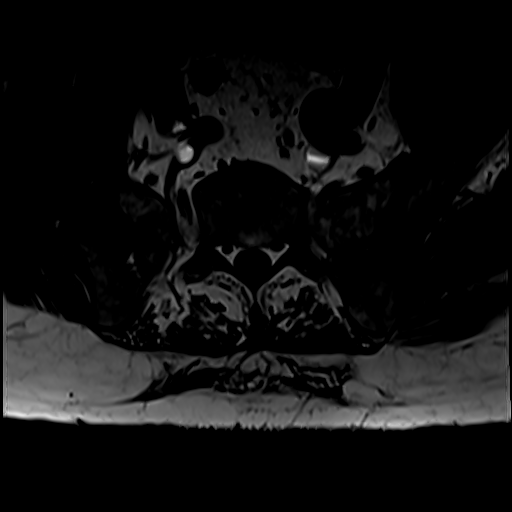
[im 9/42]
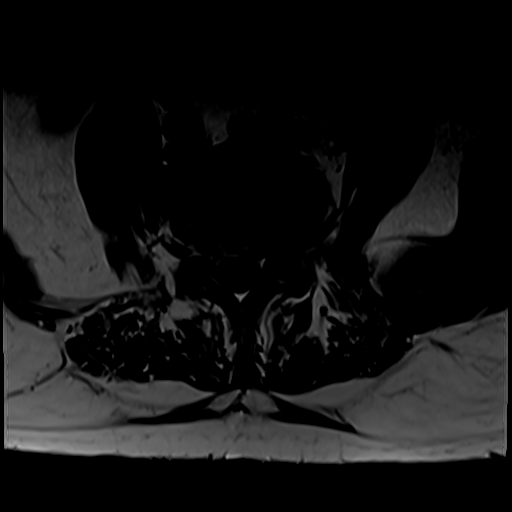
[im 17/42]
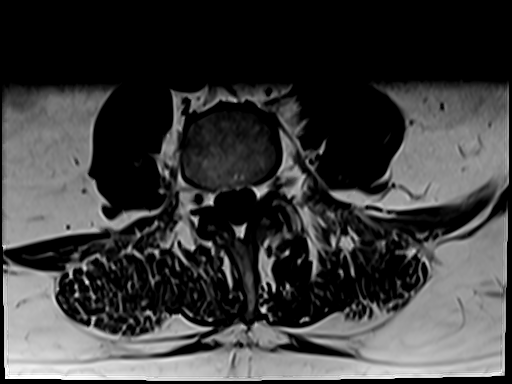
[im 25/42]
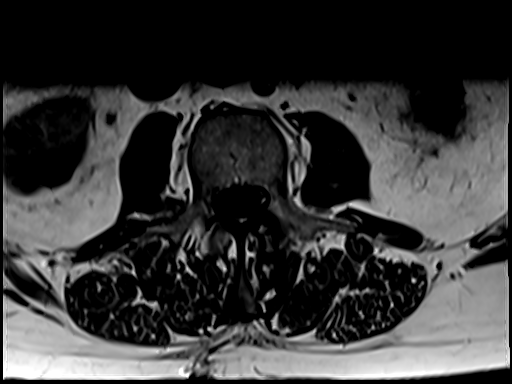
[im 33/42]
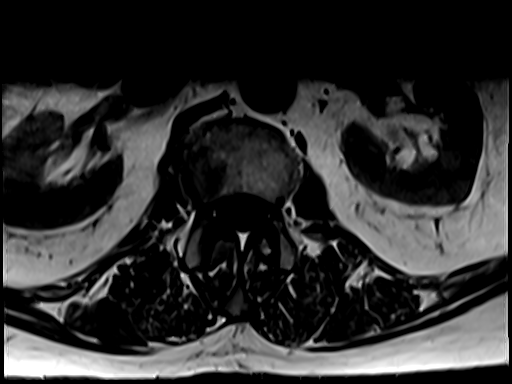
[im 42/42]
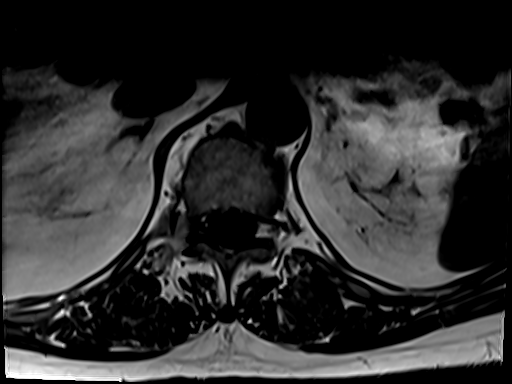

[30 of 48 positions shown; findings below may reference images not displayed]

Ordering:    PUPO, LAKEISHA

DIAGNOSTIC STUDIES

EXAM

MR lumbar spine wo con

INDICATION

lumbar radiculapathy
PT HAD LAMINECTOMY 1014 WITH BACK PAIN IMPROVED, THEN HAD KNEE REPLACEMENT, PAIN RETURNED TO LOW
BACK AND RT HIP.  RG

TECHNIQUE

Multiplanar multisequence MRI of the lumbar spine without IV contrast

COMPARISONS

None

FINDINGS

Transitional lumbosacral vertebrae which will be described as S1 in keeping with prior reporting,
with a persistent S1-S2 disc space.

Degenerative disc disease throughout the lumbar spine.

T12-L1: There is a central disc protrusion causing moderate central canal narrowing. Facet
osteoarthritis.

L1-L2: Disc bulge with mild central canal narrowing. Mild right neural foraminal narrowing.

L2-L3: Disc bulge with out significant central canal narrowing. Mild bilateral neural foraminal
narrowing.

L3-L4: Right laminectomy. Central disc protrusion and ligamentum flavum hypertrophy with severe
central canal narrowing. Moderate left neural foraminal narrowing.

L4-L5: Degenerative disc disease with disc osteophyte complex and mild central canal narrowing. Mild
to moderate neural foraminal narrowing.

L5-S1: Degenerative disc disease with disc osteophyte complex. Mild central canal narrowing.
Moderate bilateral neural foraminal narrowing.

No acute findings in the extra vertebral soft tissues.

IMPRESSION

Ligamentum flavum hypertrophy, facet osteoarthritis, and disc bulge causing severe central canal
stenosis at L3-L4, with interval right laminectomy.

Otherwise multilevel degenerative disc disease as above with central canal and neural foraminal
stenosis.

Tech Notes:

PT HAD LAMINECTOMY 1014 WITH BACK PAIN IMPROVED, THEN HAD KNEE REPLACEMENT, PAIN RETURNED TO LOW
BACK AND RT HIP.  RG

## 2021-10-27 ENCOUNTER — Encounter: Admit: 2021-10-27 | Discharge: 2021-10-27 | Payer: MEDICARE

## 2021-10-28 ENCOUNTER — Encounter: Admit: 2021-10-28 | Discharge: 2021-10-28 | Payer: MEDICARE

## 2021-10-28 ENCOUNTER — Ambulatory Visit: Admit: 2021-10-28 | Discharge: 2021-10-28 | Payer: MEDICARE

## 2021-10-28 DIAGNOSIS — R0609 Other forms of dyspnea: Secondary | ICD-10-CM

## 2021-10-28 DIAGNOSIS — M5416 Radiculopathy, lumbar region: Secondary | ICD-10-CM

## 2021-10-28 DIAGNOSIS — I00 Rheumatic fever without heart involvement: Secondary | ICD-10-CM

## 2021-10-28 DIAGNOSIS — M48062 Spinal stenosis, lumbar region with neurogenic claudication: Secondary | ICD-10-CM

## 2021-10-28 DIAGNOSIS — K219 Gastro-esophageal reflux disease without esophagitis: Secondary | ICD-10-CM

## 2021-10-28 DIAGNOSIS — R12 Heartburn: Secondary | ICD-10-CM

## 2021-10-28 DIAGNOSIS — M2550 Pain in unspecified joint: Secondary | ICD-10-CM

## 2021-10-28 DIAGNOSIS — IMO0002 Ulcer: Secondary | ICD-10-CM

## 2021-10-28 DIAGNOSIS — N429 Disorder of prostate, unspecified: Secondary | ICD-10-CM

## 2021-10-28 DIAGNOSIS — E785 Hyperlipidemia, unspecified: Secondary | ICD-10-CM

## 2021-10-28 DIAGNOSIS — N189 Chronic kidney disease, unspecified: Secondary | ICD-10-CM

## 2021-10-28 DIAGNOSIS — Z0389 Encounter for observation for other suspected diseases and conditions ruled out: Secondary | ICD-10-CM

## 2021-10-28 MED ORDER — METHYLPREDNISOLONE ACETATE 80 MG/ML IJ SUSP
80 mg | Freq: Once | INTRAMUSCULAR | 0 refills | Status: CP
Start: 2021-10-28 — End: ?
  Administered 2021-10-28: 17:00:00 80 mg via INTRAMUSCULAR

## 2021-10-28 MED ORDER — IOHEXOL 240 MG IODINE/ML IV SOLN
1 mL | Freq: Once | EPIDURAL | 0 refills | Status: CP
Start: 2021-10-28 — End: ?
  Administered 2021-10-28: 17:00:00 1 mL via EPIDURAL

## 2021-10-28 NOTE — Progress Notes

## 2021-10-29 ENCOUNTER — Encounter: Admit: 2021-10-29 | Discharge: 2021-10-29 | Payer: MEDICARE

## 2021-10-29 DIAGNOSIS — M961 Postlaminectomy syndrome, not elsewhere classified: Secondary | ICD-10-CM

## 2021-10-29 DIAGNOSIS — R29898 Other symptoms and signs involving the musculoskeletal system: Secondary | ICD-10-CM

## 2021-10-29 DIAGNOSIS — M19019 Primary osteoarthritis, unspecified shoulder: Secondary | ICD-10-CM

## 2021-10-29 DIAGNOSIS — M5416 Radiculopathy, lumbar region: Secondary | ICD-10-CM

## 2021-11-01 IMAGING — MR SHOULDRTWO
4 of 9 series · 15 of 40 positions shown · non-contrast
Comparison: none

[Series 6: T2 fat-sat · axial · right · 4.0mm · 0.58mm/px · z∈[-28,+73]mm · 4 of 24 slices shown (1 of 2)]
[im 1/24]
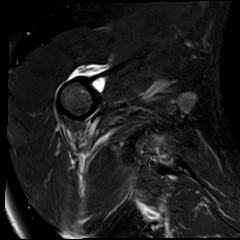
[im 8/24]
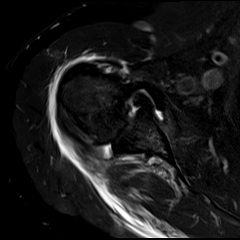
[im 16/24]
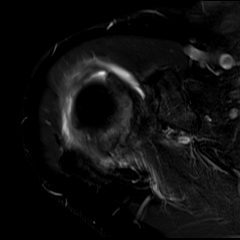
[im 24/24]
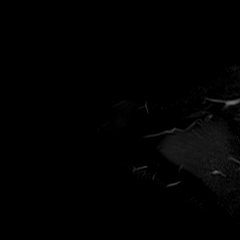

[Series 7: STIR · oblique · right · 4.0mm · 0.62mm/px · 4 of 26 slices shown]
[im 1/26]
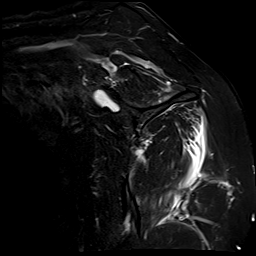
[im 9/26]
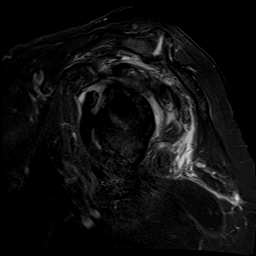
[im 17/26]
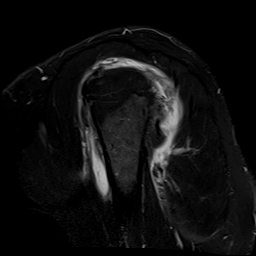
[im 26/26]
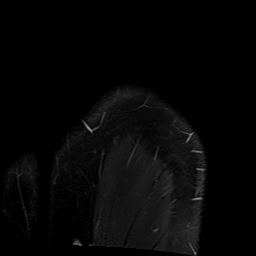

[Series 9: T2 fat-sat · oblique · right · 4.0mm · 0.50mm/px · 3 of 23 slices shown (2 of 2)]
[im 1/23]
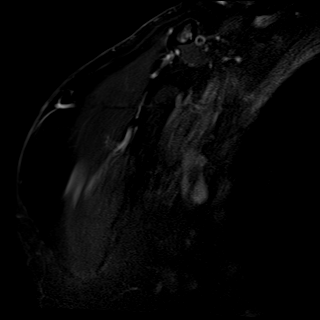
[im 12/23]
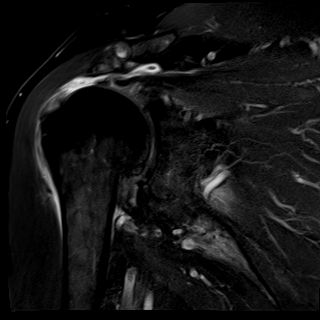
[im 23/23]
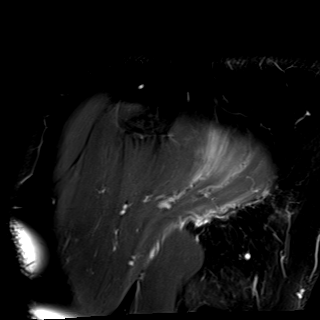

[Series 10: T1 · oblique · right · 4.0mm · 0.48mm/px · 4 of 26 slices shown]
[im 1/26]
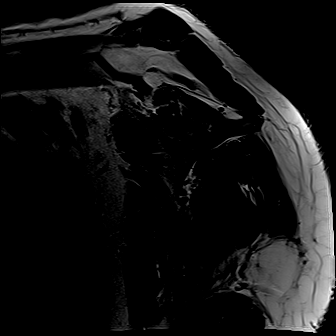
[im 9/26]
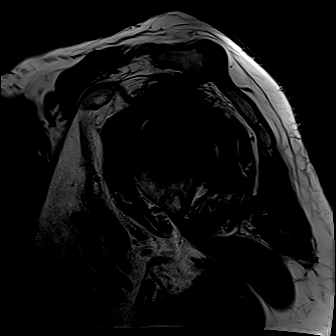
[im 17/26]
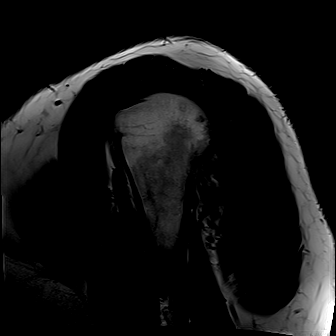
[im 26/26]
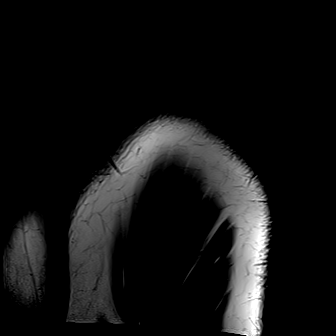

[15 of 40 positions shown; findings below may reference images not displayed]

DIAGNOSTIC STUDIES

EXAM

MR shoulder RT wo con

INDICATION

right shoulder pain
INJURY WITH CHRONIC RT SHOULDER PAIN, ANTERIOR AND POSTERIOR PAIN WITH VERY LIMITED ROM.  PREV MRI
[DATE].  RG

TECHNIQUE

Oblique coronal, oblique sagittal, and axial images were obtained with variable T1 and T2 weighting.

COMPARISONS

July 31, 2020

FINDINGS

There is a 2.3 by 3.2 cm full-thickness tear of the central rotator cuff tendon with mild
musculotendinous retraction and minimal atrophy of the supraspinatus. There is muscular edema within
the infra spinatus consistent with strain and/or muscular tear.

The sub scapularis tendon demonstrates mild tendinosis.

Bicipital tendon is torn similar to prior exam.

There is slight posterior subluxation of the humeral head in relation to the bony glenoid consistent
with anterior capsular laxity or posterior capsular contraction. Advanced degenerative changes of
the glenohumeral joint space are noted.

Severe degenerative changes of the AC joint are seen.

Shoulder effusion is noted with fluid in the subacromial and subdeltoid bursa.

There is atrophy of the teres minor. Symptoms of quadrilateral space syndrome should be excluded
clinically.

IMPRESSION

Moderate 2 large full-thickness tear of the central rotator cuff tendon with mild musculotendinous
retraction and atrophy of the supraspinatus.

Muscular edema is noted predominately involving the fibers of the infra spinatus suggesting muscular
strain and/or tear.

Atrophy of the teres minor is noted. Symptoms of quadrilateral space syndrome should be excluded
clinically.

Severe degenerative changes of the AC joint and degenerative changes of the glenohumeral joint space
with posterior subluxation of the humeral head as described.

Continued tear of the bicipital tendon.

Shoulder effusion with fluid in the subacromial subdeltoid bursa.

Tech Notes:

INJURY WITH CHRONIC RT SHOULDER PAIN, ANTERIOR AND POSTERIOR PAIN WITH VERY LIMITED ROM.  PREV MRI
[DATE].  RG

## 2021-11-03 ENCOUNTER — Encounter: Admit: 2021-11-03 | Discharge: 2021-11-03 | Payer: MEDICARE

## 2021-11-03 NOTE — Telephone Encounter
[place], Breck Coons, DO  Burman Freestone, RN  It is fine if he finishes it up in the AM, he is traveling and will be difficult for him to drink it around his appointment.          Previous Messages      ----- Message -----   From: Burman Freestone, RN   Sent: 11/02/2021  5:06 PM CDT   To: Clide Dales, DO   Subject: bowel prep                      Hi Dr. Glennon Mac,   Dr. Jaclyn Shaggy called about his procedure scheduled with you on Thursday and when he needs to finish his prep. He has an appointment with Dr. Humphrey Rolls for a should injection at 9:15 the day of his procedure (scheduled for 1550). Are you okay if he drinks 3/4 of the gallon the night before and then the rest early in the morning day of procedure? Or have him drink it at 1050 (as it would be directed) and have him just stay near bathroom at [place] (although that may be uncomfortable for him). Please advise if you are okay if he drinks the remaining 1/4 at like 6-7AM day of procedure?   Thank you,   Janett Billow    =========  Outbound call to pt. Went over recommendations. Pt verbalized understanding. Stated he may not feel comfortable drinking the bowel prep prior to his first appointment. He reported he will be at [place] will drink the rest right after his appointment.

## 2021-11-04 ENCOUNTER — Encounter: Admit: 2021-11-04 | Discharge: 2021-11-04 | Payer: MEDICARE

## 2021-11-04 ENCOUNTER — Ambulatory Visit: Admit: 2021-11-04 | Discharge: 2021-11-04 | Payer: MEDICARE

## 2021-11-04 DIAGNOSIS — Z0389 Encounter for observation for other suspected diseases and conditions ruled out: Secondary | ICD-10-CM

## 2021-11-04 DIAGNOSIS — I00 Rheumatic fever without heart involvement: Secondary | ICD-10-CM

## 2021-11-04 DIAGNOSIS — M19019 Primary osteoarthritis, unspecified shoulder: Secondary | ICD-10-CM

## 2021-11-04 DIAGNOSIS — R0609 Other forms of dyspnea: Secondary | ICD-10-CM

## 2021-11-04 DIAGNOSIS — K219 Gastro-esophageal reflux disease without esophagitis: Secondary | ICD-10-CM

## 2021-11-04 DIAGNOSIS — E785 Hyperlipidemia, unspecified: Secondary | ICD-10-CM

## 2021-11-04 DIAGNOSIS — R12 Heartburn: Secondary | ICD-10-CM

## 2021-11-04 DIAGNOSIS — N189 Chronic kidney disease, unspecified: Secondary | ICD-10-CM

## 2021-11-04 DIAGNOSIS — M2550 Pain in unspecified joint: Secondary | ICD-10-CM

## 2021-11-04 DIAGNOSIS — N429 Disorder of prostate, unspecified: Secondary | ICD-10-CM

## 2021-11-04 DIAGNOSIS — IMO0002 Ulcer: Secondary | ICD-10-CM

## 2021-11-04 DIAGNOSIS — M25511 Pain in right shoulder: Secondary | ICD-10-CM

## 2021-11-04 MED ORDER — PROPOFOL INJ 10 MG/ML IV VIAL
INTRAVENOUS | 0 refills | Status: DC
Start: 2021-11-04 — End: 2021-11-04
  Administered 2021-11-04 (×2): 30 mg via INTRAVENOUS

## 2021-11-04 MED ORDER — BUPIVACAINE (PF) 0.25 % (2.5 MG/ML) IJ SOLN
3 mL | Freq: Once | INTRAMUSCULAR | 0 refills | Status: CP
Start: 2021-11-04 — End: ?
  Administered 2021-11-04: 16:00:00 3 mL via INTRAMUSCULAR

## 2021-11-04 MED ORDER — IOHEXOL 240 MG IODINE/ML IV SOLN
1 mL | Freq: Once | EPIDURAL | 0 refills | Status: CP
Start: 2021-11-04 — End: ?
  Administered 2021-11-04: 16:00:00 1 mL via EPIDURAL

## 2021-11-04 MED ORDER — LACTATED RINGERS IV SOLP
INTRAVENOUS | 0 refills | Status: DC
Start: 2021-11-04 — End: 2021-11-04
  Administered 2021-11-04: 21:00:00 via INTRAVENOUS

## 2021-11-04 MED ORDER — PROPOFOL 10 MG/ML IV EMUL 50 ML (INFUSION)(AM)(OR)
INTRAVENOUS | 0 refills | Status: DC
Start: 2021-11-04 — End: 2021-11-04
  Administered 2021-11-04: 21:00:00 130 ug/kg/min via INTRAVENOUS

## 2021-11-04 MED ORDER — LIDOCAINE (PF) 20 MG/ML (2 %) IJ SOLN
INTRAVENOUS | 0 refills | Status: DC
Start: 2021-11-04 — End: 2021-11-04
  Administered 2021-11-04: 21:00:00 100 mg via INTRAVENOUS

## 2021-11-04 MED ORDER — TRIAMCINOLONE ACETONIDE 40 MG/ML IJ SUSP
80 mg | Freq: Once | EPIDURAL | 0 refills | Status: CP
Start: 2021-11-04 — End: ?
  Administered 2021-11-04: 16:00:00 80 mg via EPIDURAL

## 2021-11-04 NOTE — Procedures
Attending Surgeon: Rushie Nyhan, MD    Anesthesia: Local    Pre-Procedure Diagnosis:   1. Right shoulder pain, unspecified chronicity    2. Shoulder arthritis        Post-Procedure Diagnosis:   1. Right shoulder pain, unspecified chronicity    2. Shoulder arthritis        Pain Score: Eight    [place] AMB SPINE LARGE JOINT DRAIN/INJECT PROC: R glenohumeral    Consent:   Consent obtained: written  Consent given by: patient    Discussed with patient the purpose of the treatment/procedure, other ways of treating my condition, including no treatment/ procedure and the risks and benefits of the alternatives. Patient has decided to proceed with treatment/procedure.   Procedures Details:  Procedure Peformed: Injection Only  Indications: pain and diagnostic evaluation  Details:Prep: 2% chlorhexidine   25 G needle, fluoroscopy-guided       anterior approachOutcome: tolerated well, no immediate complications  Comments: 26m bupi and '80mg'$  kenalog inj          Estimated blood loss: none or minimal  Specimens: none  Patient tolerated the procedure well with no immediate complications. Pressure was applied, and hemostasis was accomplished.

## 2021-11-04 NOTE — Anesthesia Post-Procedure Evaluation
Post-Anesthesia Evaluation    Name: Joe Whitehead, Dr.      MRN: [id number]     DOB: [birth year]     Age: 71 y.o.     Sex: male   __________________________________________________________________________     Procedure Information     Anesthesia Start Date/Time: 11/04/21 1619    Procedures:       COLONOSCOPY DIAGNOSTIC WITH SPECIMEN COLLECTION BY BRUSHING/ WASHING - FLEXIBLE      COLONOSCOPY WITH SNARE REMOVAL TUMOR/ POLYP/ OTHER LESION    Location: ENDO 6 (IR) / ENDO/GI    Surgeons: Clide Dales, DO          Post-Anesthesia Vitals  BP: 147/85 (08/31 1721)  Temp: 37 C (98.6 F) (08/31 1645)  Pulse: 78 (08/31 1721)  Respirations: 18 PER MINUTE (08/31 1721)  SpO2: 97 % (08/31 1721)  SpO2 Pulse: 62 (08/31 1710)  O2 Device: None (Room air) (08/31 1721)   Vitals Value Taken Time   BP 147/85 11/04/21 1721   Temp     Pulse 78 11/04/21 1721   Respirations 18 PER MINUTE 11/04/21 1721   SpO2 97 % 11/04/21 1721   O2 Device None (Room air) 11/04/21 1721   ABP     ART BP           Post Anesthesia Evaluation Note    Evaluation location: Pre/Post  Patient participation: recovered; patient participated in evaluation  Level of consciousness: alert  Pain management: adequate    Hydration: normovolemia  Temperature: 36.0C - 38.4C  Airway patency: adequate    Perioperative Events       Post-op nausea and vomiting: no PONV    Postoperative Status  Cardiovascular status: hemodynamically stable  Respiratory status: spontaneous ventilation  Follow-up needed: none        Perioperative Events  There were no known notable events for this encounter.

## 2021-11-04 NOTE — Patient Instructions
Procedure Completed Today: Joint Injection  shoulder    Important information following your procedure today: You may drive today    Pain relief may not be immediate. It is possible you may even experience an increase in pain during the first 24-48 hours followed by a gradual decrease of your pain.  Though the procedure is generally safe and complications are rare, we do ask that you be aware of any of the following:   Any swelling, persistent redness, new bleeding, or drainage from the site of the injection.  You should not experience a severe headache.  You should not run a fever over 101? F.  New onset of sharp, severe back & or neck pain.  New onset of upper or lower extremity numbness or weakness.  New difficulty controlling bowel or bladder function after the injection.  New shortness of breath.    If any of these occur, please call to report this occurrence to a nurse at [phone]. If you are calling after 4:00 p.m., on weekends or holidays please call [phone] and ask to have the resident physician on call for the physician paged or go to your local emergency room.  You may experience soreness at the injection site. Ice can be applied at 20 minute intervals. Avoid application of direct heat, hot showers or hot tubs today.  Avoid strenuous activity today. You may resume your regular activities and exercise tomorrow.  Patients with diabetes may see an elevation in blood sugars for 7-10 days after the injection. It is important to pay close attention to your diet, check your blood sugars daily and report extreme elevations to the physician that treats your diabetes.  Patients taking a daily blood thinner can resume their regular dose this evening.  It is important that you take all medications ordered by your pain physician. Taking medication as ordered is an important part of your pain care plan. If you cannot continue the medication plan, please notify the physician.     Possible side effects to steroids that may occur:  Flushing or redness of the face  Irritability  Fluid retention  Change in women?s menses    The following medications were used: Bupivicaine  , Triamcinolone  , and Contrast Dye

## 2021-11-04 NOTE — Progress Notes

## 2021-11-04 NOTE — Progress Notes
SPINE CENTER  INTERVENTIONAL PAIN PROCEDURE HISTORY AND PHYSICAL    Chief Complaint: Pain    HISTORY OF PRESENT ILLNESS:  pain    Medical History:   Diagnosis Date   ? 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 DOE (dyspnea on exertion) 09/22/2016   ? GERD (gastroesophageal reflux disease) 09/22/2016   ? Heartburn    ? Hyperlipemia 09/22/2016   ? Joint pain 2013    Chronic and long standing   ? Observation for suspected cardiovascular disease 09/22/2016    03/19/2010  Exercise Echo:  NSR PVC's noted throughout study.  Normal LV sz and function.  Normal hyperdynamic response for all segments.  No Echocardiographic evidence of ischemia.     ? Prostate disorder    ? Rheumatic fever    ? Ulcer     Stomach Issues       Surgical History:   Procedure Laterality Date   ? SPINE SURGERY  1997    R L5-S1 microdiscectomy   ? Colonoscopy N/A 01/03/2019    Performed by Tempie Hoist, DO at [facility] ENDO   ? COLONOSCOPY WITH SNARE REMOVAL TUMOR/ POLYP/ OTHER LESION  01/03/2019    Performed by Tempie Hoist, DO at [facility] ENDO   ? COLONOSCOPY WITH BIOPSY - FLEXIBLE  01/03/2019    Performed by Tempie Hoist, DO at [facility] ENDO   ? LAMINECTOMY WITH EXPLORATION/ DECOMPRESSION SPINAL CORD / CAUDA EQUINA - 1-2 SEGMENTS - LUMBAR Lumbar 3 Laminectomy, Minimally Invasive Surgery N/A 06/11/2021    Performed by Pablo [place], MD at [facility] OR   ? ARTHROPLASTY  07/13/2021    R knee arthroplasty   ? BLADDER SUSPENSION      Dr. Larwance Rote - urology   ? CLAVICLE SURGERY  13 and 17 years    X2 Left   ? ELBOW SURGERY  1974, 1989   ? HX JOINT REPLACEMENT  07/13/21    I am 1 week post op from a R knee arthroplasty   ? KNEE ARTHROSCOPY      bilateral   ? KNEE SURGERY  07/13/21    R knee arthroplasty   ? NERVE BIOPSY     ? ULNAR TUNNEL RELEASE Right        family history includes Arthritis in his mother; COPD in his father and mother; Cancer in his mother; Multiple sclerosis in his brother; Neurologic Disorder in his father and paternal aunt; Osteoporosis in his mother.    Social History     Socioeconomic History   ? Marital status: Married   Occupational History   ? Occupation: Physicial   Tobacco Use   ? Smoking status: Never   ? Smokeless tobacco: Never   Vaping Use   ? Vaping Use: Never used   Substance and Sexual Activity   ? Alcohol use: Not Currently     Comment: I stopped drinking alcohol 6 years ago   ? Drug use: Never   ? Sexual activity: Not Currently     Partners: Female     Birth control/protection: None       Allergies   Allergen Reactions   ? Crestor [Rosuvastatin] MUSCLE PAIN     aching muscles        Vitals:    11/04/21 0954   BP: (!) 149/82   BP Source: Arm, Right Upper   Pulse: 68   Temp: 36.9 ?C (98.4 ?F)   Resp: 18   SpO2:  100%   TempSrc: Oral   PainSc: Eight   Weight: 109.8 kg (242 lb)   Height: 180.3 cm (5' 11)     Pain Score: Eight  Oswestry Total Score:: 12    REVIEW OF SYSTEMS: 10 point ROS obtained and negative except pain      PHYSICAL EXAM:unchanged        IMPRESSION:    1. Right shoulder pain, unspecified chronicity    2. Shoulder arthritis         PLAN: Other rt shoulder inj

## 2021-11-05 ENCOUNTER — Encounter: Admit: 2021-11-05 | Discharge: 2021-11-05 | Payer: MEDICARE

## 2021-11-05 DIAGNOSIS — E785 Hyperlipidemia, unspecified: Secondary | ICD-10-CM

## 2021-11-05 DIAGNOSIS — I00 Rheumatic fever without heart involvement: Secondary | ICD-10-CM

## 2021-11-05 DIAGNOSIS — IMO0002 Ulcer: Secondary | ICD-10-CM

## 2021-11-05 DIAGNOSIS — Z0389 Encounter for observation for other suspected diseases and conditions ruled out: Secondary | ICD-10-CM

## 2021-11-05 DIAGNOSIS — N189 Chronic kidney disease, unspecified: Secondary | ICD-10-CM

## 2021-11-05 DIAGNOSIS — R12 Heartburn: Secondary | ICD-10-CM

## 2021-11-05 DIAGNOSIS — R0609 Other forms of dyspnea: Secondary | ICD-10-CM

## 2021-11-05 DIAGNOSIS — K219 Gastro-esophageal reflux disease without esophagitis: Secondary | ICD-10-CM

## 2021-11-05 DIAGNOSIS — M2550 Pain in unspecified joint: Secondary | ICD-10-CM

## 2021-11-05 DIAGNOSIS — N429 Disorder of prostate, unspecified: Secondary | ICD-10-CM

## 2021-11-11 ENCOUNTER — Encounter: Admit: 2021-11-11 | Discharge: 2021-11-11 | Payer: MEDICARE

## 2021-11-11 NOTE — Telephone Encounter
Received incoming VM from patient asking if his appointment on Monday can be telehelath.   Appointment type changed to telehealth. Outbound call to pt. Left detailed VM for pt (authorization on file). Provided call back number.

## 2021-11-15 ENCOUNTER — Encounter: Admit: 2021-11-15 | Discharge: 2021-11-15 | Payer: MEDICARE

## 2021-11-15 ENCOUNTER — Ambulatory Visit: Admit: 2021-11-15 | Discharge: 2021-11-16 | Payer: MEDICARE

## 2021-11-15 DIAGNOSIS — R0609 Other forms of dyspnea: Secondary | ICD-10-CM

## 2021-11-15 DIAGNOSIS — I00 Rheumatic fever without heart involvement: Secondary | ICD-10-CM

## 2021-11-15 DIAGNOSIS — IMO0002 Ulcer: Secondary | ICD-10-CM

## 2021-11-15 DIAGNOSIS — N189 Chronic kidney disease, unspecified: Secondary | ICD-10-CM

## 2021-11-15 DIAGNOSIS — R1084 Generalized abdominal pain: Secondary | ICD-10-CM

## 2021-11-15 DIAGNOSIS — Z86010 Personal history of colonic polyps: Secondary | ICD-10-CM

## 2021-11-15 DIAGNOSIS — M2550 Pain in unspecified joint: Secondary | ICD-10-CM

## 2021-11-15 DIAGNOSIS — E785 Hyperlipidemia, unspecified: Secondary | ICD-10-CM

## 2021-11-15 DIAGNOSIS — N429 Disorder of prostate, unspecified: Secondary | ICD-10-CM

## 2021-11-15 DIAGNOSIS — R12 Heartburn: Secondary | ICD-10-CM

## 2021-11-15 DIAGNOSIS — R748 Abnormal levels of other serum enzymes: Secondary | ICD-10-CM

## 2021-11-15 DIAGNOSIS — R109 Unspecified abdominal pain: Secondary | ICD-10-CM

## 2021-11-15 DIAGNOSIS — K219 Gastro-esophageal reflux disease without esophagitis: Secondary | ICD-10-CM

## 2021-11-15 DIAGNOSIS — Z0389 Encounter for observation for other suspected diseases and conditions ruled out: Secondary | ICD-10-CM

## 2021-11-15 NOTE — Progress Notes
Telehealth Visit Note    Date of Service: 11/15/2021    Subjective:           Joe Whitehead  Joe Whitehead is a 71 y.o. male here for follow up.       History of Present Illness       Joe Whitehead is a 71 year old with history of hyperlipidemia, CKD, GERD?and arthritis who established care in July 2017 with vague abdominal pain in the epigastrium . 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 ?The pain is nonradiating, never tender to his touch and feels deep. ?He has no associated nausea, vomiting, flushing, diaphoresis and has had no change in bowels, though he has had a few episodes of rectal bleeding, small quantity. He does have some bloating. ?The pain is present but not enough to affect his daily activities. ?He has reflux without dysphagia and changed his eating habits a bit by reducing his caffeine intake, which has helped the reflux but not significantly altered the epigastric pain. He also has tried a PPI and H2 blocker, PPI felt that it provided more relief of the heartburn. ?He denies tobacco but does drink socially 1-2 drinks and admits this has become more frequent. 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   ?  In review of his outside history, he had a CT scan completed on September 25, 2015 with normal appearing liver without evidence of intrahepatic biliary duct dilation. ?There is poor enhancement of the hepatic and portal veins. ?A 3.8 cm low-attenuation lesion within the hepatic dome. ?The spleen size is normal. ?There are subtle punctate calcifications within the pancreas. ?Gallbladder and adrenal glands appear grossly normal. ?There is no hydronephrosis. ?There is a nondistended stomach with at least mild gastric mucosal thickening. ?With mild to moderate degree of atherosclerotic change. ?Chest x-ray dated August 2016 showed increasing interstitial markings within the bilateral lower lung fields, representing interstitial pneumonitis. ?There are no effusions. ?CT of the chest dated 05/2013: Normal chest CT, stable CT the abdomen with what appears to be a 4 cm hemangioma in the right lobe of the liver with stability dating back to 2008, diverticuli in the colon, some prostatic enlargement with a lumpy bumpy contour. 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 ?EGD in March 2015 shows normal-appearing duodenum, striped linear erosive erythema and the gastric body to antrum, no ulcers or erosions, irregular esophageal junction with one small column of salmon appearing mucosa. ?Biopsies were without H. pylori and no evidence of Barrett's. ?EGD dated 2012 moderate gastritis with some friability and granularity, otherwise normal.   ?  We repeated EGD, EUS and colonoscopy in December 2017. ?EGD found one small salmon colored patch mucosa extending from the GE junction. ?Otherwise the stomach and small bowel were normal. ?The GE biopsies only showed inflammation without evidence of Barrett's. ?Endoscopic ultrasound revealed a normal liver, biliary tree, gallbladder and pancreas. ?There is no evidence of chronic pancreatitis or ductal dilation. ?Colonoscopy was also done that found  to polyps in the ascending colon and one polyp in the sigmoid colon all consistent with tubular adenoma with plans for follow-up colonoscopy in 3 years. Colonscopy in August 2023 with 1 small TA with plans for follow up in 2028.  ?  He returns today for routine follow-up. ?Overall he reports that he has been doing very well. He has stopped all ETOH and not had any since July 2017.  He has no abdominal discomfort, nausea,?vomiting and bowels are moving well. He uses docusate about every other day. Uses TUMS rarely and once and a while will grab a Nexium. Triggers are tomato based and sweets.  He is using Tylenol for pain, avoids NSAIDS. He reports that his cholesterol is elevated but hasn't tolerated statins or zetia and plans to discuss this with his PCP.  Inquires about NASH, local labs with no transaminitis, platelets are lower limit of normal.  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             Review of Systems   Constitutional: Negative.    HENT: Negative.    Eyes: Negative.    Respiratory: Negative.    Cardiovascular: Negative.    Gastrointestinal: Negative.    Endocrine: Negative.    Genitourinary: Negative.    Musculoskeletal: Negative.    Skin: Negative.    Allergic/Immunologic: Negative.    Neurological: Negative.    Hematological: Negative.    Psychiatric/Behavioral: Negative.        .  Objective:         ? acetaminophen SR (TYLENOL) 650 mg tablet Take two tablets by mouth twice daily.   ? docusate (COLACE) 100 mg capsule Take one capsule by mouth twice daily.   ? ERGOCALCIFEROL (VITAMIN D2) (VITAMIN D PO) Take 2,000 Units by mouth daily.   ? esomeprazole DR (NEXIUM) 20 mg capsule Take one capsule by mouth as Needed (1-2x every 3 weeks). Take on an empty stomach at least 1 hour before or 2 hours after food.   ? eszopiclone (LUNESTA) 3 mg tablet Take one tablet by mouth at bedtime as needed for Sleep.   ? glucosamine/chondr su A sod (OSTEO BI-FLEX PO) Take 2 tablets by mouth daily. Indications: per patient takes 2 tab per day   ? mirabegron (MYRBETRIQ) 50 mg ER tablet Take 25 mg by mouth daily.   ? peg 3350-electrolytes (GOLYTELY) 236-22.74-6.74 -5.86 gram oral solution Mix as directed on package. Drink as directed by the GI clinic. Refrigerate once mixed.   ? tadalafiL (CIALIS) 20 mg tablet Take one-half tablet by mouth as Needed for Erectile dysfunction.          Telehealth Patient Reported Vitals     Row Name 11/15/21 0956                Weight: 108 kg (238 lb)                  Telehealth Body Mass Index: 0 at 11/15/2021  9:57 AM    Physical Exam  Constitutional:       Appearance: Normal appearance.   Neurological:      General: No focal deficit present.      Mental Status: He is alert and oriented to person, place, and time.   Psychiatric:         Mood and Affect: Mood normal.         Behavior: Behavior normal.     Exam limited due to telehealth.  Assessment and Plan:      Joe Whitehead is a 70 year old with history of hyperlipidemia, CKD, GERD?and arthritis who initially presented?with vague abdominal pain in the epigastrium  and bloating without other significant symptoms, denying nausea, vomiting or change in bowels who established care in July 2017.  ?  He has had two prior EGDs, both showing gastritis but biopsies were negative for H pylori. ?CT scan?in 2017?showed some calcifications in the pancreas, which were not seen on prior imaging suggestive of chronic pancreatitis and also some thickening of the gastric wall.?  ?  We repeated EGD, EUS and colonoscopy in December 2017. ?EGD found one small salmon colored patch mucosa extending from the GE junction. ?Otherwise the stomach and small bowel were normal. ?The GE biopsies only showed inflammation without evidence of Barrett's. ?Endoscopic ultrasound revealed a normal liver, biliary tree, gallbladder and pancreas. ?There is no evidence of chronic pancreatitis or ductal dilation. ?Colonoscopy was also done that found two polyps in the ascending colon and one polyp in the sigmoid colon all consistent with tubular adenoma. Colonoscopy in 2023 with one 3mm TA with follow up planned in 2028.     Despite not seeing features of chronic pancreatitis on the EUS, there were noted calcifications on prior CT. ?He has stopped EOTH completely since July 2017 and reports remarkably improvement with resolution in all of his symptoms. He continues to do well.  He has some concern about fatty liver today.  Local blood work has been unremarkable.  Recommend yearly blood work with CBC, CMP, INR. Also recommend RUQ [place] with doppler. Recommend control of glucose, cholesterol/lipids and weight.     May take Tylenol up to 2000mg  daily.   ?  Reflux is well controlled at this time and is only taking TUMS rarely and Nexium even less frequently. No dysphagia.     ?  After discussion, we will proceed with the following:  - Continue complete ETOH cessation  - May continue TUMS as needed, if needing frequently recommend esomeprazole 20mg  in the AM.   - Yearly blood work that can be completed locally:  CBC, CMP, INR  - RUQ [place] with doppler to assess fatty liver, hx of chronic pancreatitis, will fax to [facility]   - Colonoscopy in 2028 for surveillance.  Suprep.   - Avoid NSAIDS, may use Tylenol up to 2000mg  daily.   - RTC in 1 year via telehealth  ?  ?  All questions answered and Joe Whitehead agrees with the current plan.   Appreciate the consult, please contact us if there are further questions.                          20 minutes spent on this patient's encounter with counseling and coordination of care taking >50% of the visit.

## 2021-11-15 NOTE — Telephone Encounter
Successfully faxed [place] order to [facility] [phone].

## 2021-11-22 ENCOUNTER — Encounter: Admit: 2021-11-22 | Discharge: 2021-11-22 | Payer: MEDICARE

## 2021-11-22 NOTE — Telephone Encounter
Called and spoke with radiology. Orders clarified at this time. No further questions.

## 2021-11-22 NOTE — Telephone Encounter
Incoming call from outside radiology asking what we are looking for on duplex study, if it was just the liver doppler or were they needing to focus on anything else? Asking for a return call to [phone] for clarification    Routing to Dr. Glennon Mac. Please clarify. I do see it was ordered for elevated liver enzymes.

## 2021-11-24 ENCOUNTER — Encounter: Admit: 2021-11-24 | Discharge: 2021-11-24 | Payer: MEDICARE

## 2021-11-24 ENCOUNTER — Ambulatory Visit: Admit: 2021-11-24 | Discharge: 2021-11-25 | Payer: MEDICARE

## 2021-11-24 DIAGNOSIS — R12 Heartburn: Secondary | ICD-10-CM

## 2021-11-24 DIAGNOSIS — M19019 Primary osteoarthritis, unspecified shoulder: Secondary | ICD-10-CM

## 2021-11-24 DIAGNOSIS — K219 Gastro-esophageal reflux disease without esophagitis: Secondary | ICD-10-CM

## 2021-11-24 DIAGNOSIS — R0609 Other forms of dyspnea: Secondary | ICD-10-CM

## 2021-11-24 DIAGNOSIS — IMO0002 Ulcer: Secondary | ICD-10-CM

## 2021-11-24 DIAGNOSIS — I00 Rheumatic fever without heart involvement: Secondary | ICD-10-CM

## 2021-11-24 DIAGNOSIS — M47816 Spondylosis without myelopathy or radiculopathy, lumbar region: Secondary | ICD-10-CM

## 2021-11-24 DIAGNOSIS — N429 Disorder of prostate, unspecified: Secondary | ICD-10-CM

## 2021-11-24 DIAGNOSIS — N189 Chronic kidney disease, unspecified: Secondary | ICD-10-CM

## 2021-11-24 DIAGNOSIS — E785 Hyperlipidemia, unspecified: Secondary | ICD-10-CM

## 2021-11-24 DIAGNOSIS — Z0389 Encounter for observation for other suspected diseases and conditions ruled out: Secondary | ICD-10-CM

## 2021-11-24 DIAGNOSIS — M2550 Pain in unspecified joint: Secondary | ICD-10-CM

## 2021-11-24 DIAGNOSIS — M5136 Other intervertebral disc degeneration, lumbar region: Secondary | ICD-10-CM

## 2021-11-24 NOTE — Progress Notes
Todays visit took place via face-to-face encounter utilizing Zoom application. Total time 20 minutes.      SPINE CENTER HISTORY AND PHYSICAL    Chief Complaint   Patient presents with   ? Back Pain   ? Shoulder Pain            HISTORY OF PRESENT ILLNESS:    Chronic back and shoulder pain   Recent eval 6 weeks ago   Since that time had injections     Back and leg pain   Was painful along the lateral hip and front thigh - mostly resolved since inj   Longer periods of standing aggravate the symptoms   Loses strength and endurance  Describes as aching and tiring  Resting helps   Standing and walking makes worse   Currently feeling more stiffness in the right lower back, feels this is wrose in am and gets better as he gets moving    Knee surgery May 9th which has exacerbated all these symptoms   Knee pain Is better   Gait is imbalanced   Favoring the left more than right   Less wt bearing the right     Right shoulder pain   Hx of rotator cuff tears   Seeing Dr Warren Danes   Planning reverse surgery   Had recent shoulder GH joint inj with moderate relief of pain but still finds ROM limited moderately   Can lift the right arm with his left, but independently feels challenged     MEDS   Has not taken anything in the last 2 weeks   Acetaminophen at [facility] for yrs which helps  After surgery was taking BID   Unable to take NSAIDS due to CKD     TRX   L2 and L3 TFESI on right with 90% relief - able to walk a mile and half without stopping   L3 lami with Dr Earlene Plater April 2023  PT compelted for knee and lower back > 6 weeks in the last 3 month  -without impact   Shoulder joint injection GH right side - moderate relief of pain >50% but still having moderate limitation in ROM        Medical History:   Diagnosis Date   ? 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 DOE (dyspnea on exertion) 09/22/2016   ? GERD (gastroesophageal reflux disease) 09/22/2016   ? Heartburn    ? Hyperlipemia 09/22/2016   ? Joint pain 2013    Chronic and long standing   ? Observation for suspected cardiovascular disease 09/22/2016    03/19/2010  Exercise Echo:  NSR PVC's noted throughout study.  Normal LV sz and function.  Normal hyperdynamic response for all segments.  No Echocardiographic evidence of ischemia.     ? Prostate disorder    ? Rheumatic fever    ? Ulcer     Stomach Issues       Surgical History:   Procedure Laterality Date   ? SPINE SURGERY  1997    R L5-S1 microdiscectomy   ? Colonoscopy N/A 01/03/2019    Performed by Tempie Hoist, DO at [facility] ENDO   ? COLONOSCOPY WITH SNARE REMOVAL TUMOR/ POLYP/ OTHER LESION  01/03/2019    Performed by Tempie Hoist, DO at [facility] ENDO   ? COLONOSCOPY WITH BIOPSY - FLEXIBLE  01/03/2019    Performed by Tempie Hoist, DO at [facility] ENDO   ? LAMINECTOMY WITH EXPLORATION/ DECOMPRESSION  SPINAL CORD / CAUDA EQUINA - 1-2 SEGMENTS - LUMBAR Lumbar 3 Laminectomy, Minimally Invasive Surgery N/A 06/11/2021    Performed by Pablo [place], MD at [facility] OR   ? COLONOSCOPY DIAGNOSTIC WITH SPECIMEN COLLECTION BY BRUSHING/ WASHING - FLEXIBLE N/A 11/04/2021    Performed by Tempie Hoist, DO at [facility] ENDO   ? COLONOSCOPY WITH SNARE REMOVAL TUMOR/ POLYP/ OTHER LESION  11/04/2021    Performed by Tempie Hoist, DO at [facility] ENDO   ? ARTHROPLASTY  07/13/2021    R knee arthroplasty   ? BLADDER SUSPENSION      Dr. Larwance Rote - urology   ? CLAVICLE SURGERY  13 and 17 years    X2 Left   ? ELBOW SURGERY  1974, 1989   ? HX JOINT REPLACEMENT  07/13/21    I am 1 week post op from a R knee arthroplasty   ? KNEE ARTHROSCOPY      bilateral   ? KNEE SURGERY  07/13/21    R knee arthroplasty   ? NERVE BIOPSY     ? ULNAR TUNNEL RELEASE Right        family history includes Arthritis in his mother; COPD in his father and mother; Cancer in his mother; Multiple sclerosis in his brother; Neurologic Disorder in his father and paternal aunt; Osteoporosis in his mother.    Social History     Socioeconomic History   ? Marital status: Married   Occupational History   ? Occupation: Physicial   Tobacco Use   ? Smoking status: Never   ? Smokeless tobacco: Never   Vaping Use   ? Vaping Use: Never used   Substance and Sexual Activity   ? Alcohol use: Not Currently     Comment: I stopped drinking alcohol 6 years ago   ? Drug use: Never   ? Sexual activity: Not Currently     Partners: Female     Birth control/protection: None               Allergies   Allergen Reactions   ? Crestor [Rosuvastatin] MUSCLE PAIN     aching muscles          Current Outpatient Medications:   ?  acetaminophen SR (TYLENOL) 650 mg tablet, Take two tablets by mouth twice daily., Disp: , Rfl:   ?  docusate (COLACE) 100 mg capsule, Take one capsule by mouth twice daily., Disp: , Rfl:   ?  ERGOCALCIFEROL (VITAMIN D2) (VITAMIN D PO), Take 2,000 Units by mouth daily., Disp: , Rfl:   ?  esomeprazole DR (NEXIUM) 20 mg capsule, Take one capsule by mouth as Needed (1-2x every 3 weeks). Take on an empty stomach at least 1 hour before or 2 hours after food., Disp: , Rfl:   ?  eszopiclone (LUNESTA) 3 mg tablet, Take one tablet by mouth at bedtime as needed for Sleep., Disp: , Rfl:   ?  glucosamine/chondr su A sod (OSTEO BI-FLEX PO), Take 2 tablets by mouth daily. Indications: per patient takes 2 tab per day, Disp: , Rfl:   ?  mirabegron (MYRBETRIQ) 50 mg ER tablet, Take 25 mg by mouth daily., Disp: , Rfl:   ?  peg 3350-electrolytes (GOLYTELY) 236-22.74-6.74 -5.86 gram oral solution, Mix as directed on package. Drink as directed by the GI clinic. Refrigerate once mixed., Disp: 4000 mL, Rfl: 0  ?  tadalafiL (CIALIS) 20 mg tablet, Take one-half tablet by mouth as Needed for Erectile dysfunction., Disp: , Rfl:  There were no vitals filed for this visit.    Oswestry Total Score:: 10    Total: 1 Total Score (10/14/2021  8:43 AM)  Opioid Risk Level: Low (10/14/2021  8:43 AM)    Is a controlled substance agreement on file?No         There is no height or weight on file to calculate BMI.    Review of Systems         PHYSICAL EXAM: General: Alert, oriented, mild distress  HEENT: normocephalic  Resp: Non labored breathing, no distress  MS: Sits during call . Right shoulder joint with limited active ROM  Behavior: Calm, cooperative, behavior and speech normal.       RADIOGRAPHIC EVALUATION:          IMPRESSION:    1. Lumbar spondylosis    2. Degenerative disc disease, lumbar    3. Shoulder arthritis          PLAN:   Updated MRI reviewed   Does have facet and DDD changes. Severe central narrowing at L3 and Mild central narrowing L4 and L5   Leg pain resolved, now moslty back stiffness   Plan f.u in 3 mo   If sx worsen can repeat inj vs consider MBB in future if mostly axial back pain   Continue f.u with Dr Warren Danes for shoulder   Encouraged strengthening, stretching and conservative therapies  Continue medication (Acetaminophen )

## 2021-12-10 ENCOUNTER — Encounter: Admit: 2021-12-10 | Discharge: 2021-12-10 | Payer: MEDICARE

## 2021-12-17 ENCOUNTER — Encounter: Admit: 2021-12-17 | Discharge: 2021-12-17 | Payer: MEDICARE

## 2021-12-17 DIAGNOSIS — R1084 Generalized abdominal pain: Secondary | ICD-10-CM

## 2021-12-17 DIAGNOSIS — R748 Abnormal levels of other serum enzymes: Secondary | ICD-10-CM

## 2022-03-30 ENCOUNTER — Encounter: Admit: 2022-03-30 | Discharge: 2022-03-30 | Payer: MEDICARE

## 2022-03-30 DIAGNOSIS — M5416 Radiculopathy, lumbar region: Secondary | ICD-10-CM

## 2022-03-31 ENCOUNTER — Ambulatory Visit: Admit: 2022-03-31 | Discharge: 2022-03-31 | Payer: MEDICARE

## 2022-03-31 ENCOUNTER — Encounter: Admit: 2022-03-31 | Discharge: 2022-03-31 | Payer: MEDICARE

## 2022-03-31 DIAGNOSIS — E785 Hyperlipidemia, unspecified: Secondary | ICD-10-CM

## 2022-03-31 DIAGNOSIS — IMO0002 Ulcer: Secondary | ICD-10-CM

## 2022-03-31 DIAGNOSIS — N189 Chronic kidney disease, unspecified: Secondary | ICD-10-CM

## 2022-03-31 DIAGNOSIS — Z0389 Encounter for observation for other suspected diseases and conditions ruled out: Secondary | ICD-10-CM

## 2022-03-31 DIAGNOSIS — M2550 Pain in unspecified joint: Secondary | ICD-10-CM

## 2022-03-31 DIAGNOSIS — M48062 Spinal stenosis, lumbar region with neurogenic claudication: Secondary | ICD-10-CM

## 2022-03-31 DIAGNOSIS — R0609 Other forms of dyspnea: Secondary | ICD-10-CM

## 2022-03-31 DIAGNOSIS — I00 Rheumatic fever without heart involvement: Secondary | ICD-10-CM

## 2022-03-31 DIAGNOSIS — R12 Heartburn: Secondary | ICD-10-CM

## 2022-03-31 DIAGNOSIS — M5416 Radiculopathy, lumbar region: Secondary | ICD-10-CM

## 2022-03-31 DIAGNOSIS — K219 Gastro-esophageal reflux disease without esophagitis: Secondary | ICD-10-CM

## 2022-03-31 DIAGNOSIS — N429 Disorder of prostate, unspecified: Secondary | ICD-10-CM

## 2022-03-31 MED ORDER — IOHEXOL 240 MG IODINE/ML IV SOLN
1 mL | Freq: Once | EPIDURAL | 0 refills | Status: CP
Start: 2022-03-31 — End: ?
  Administered 2022-03-31: 22:00:00 1 mL via EPIDURAL

## 2022-03-31 MED ORDER — BUPIVACAINE (PF) 0.25 % (2.5 MG/ML) IJ SOLN
2 mL | Freq: Once | INTRAMUSCULAR | 0 refills | Status: CP
Start: 2022-03-31 — End: ?
  Administered 2022-03-31: 22:00:00 2 mL via INTRAMUSCULAR

## 2022-03-31 MED ORDER — METHYLPREDNISOLONE ACETATE 80 MG/ML IJ SUSP
80 mg | Freq: Once | INTRAMUSCULAR | 0 refills | Status: CP
Start: 2022-03-31 — End: ?
  Administered 2022-03-31: 22:00:00 80 mg via INTRAMUSCULAR

## 2022-03-31 NOTE — Patient Instructions
Procedure Completed Today: Lumbar Transforaminal Steroid Injection L2-3 L3-4 RIGHT SIDE      Important information following your procedure today: You may drive today    Pain relief may not be immediate. It is possible you may even experience an increase in pain during the first 24-48 hours followed by a gradual decrease of your pain.  Though the procedure is generally safe and complications are rare, we do ask that you be aware of any of the following:   Any swelling, persistent redness, new bleeding, or drainage from the site of the injection.  You should not experience a severe headache.  You should not run a fever over 101? F.  New onset of sharp, severe back & or neck pain.  New onset of upper or lower extremity numbness or weakness.  New difficulty controlling bowel or bladder function after the injection.  New shortness of breath.    If any of these occur, please call to report this occurrence to Dr. Welton Flakes at [phone]. If you are calling after 4:00 p.m., on weekends or holidays please call [phone] and ask to have the resident physician on call for the physician paged or go to your local emergency room.  You may experience soreness at the injection site. Ice can be applied at 20 minute intervals. Avoid application of direct heat, hot showers or hot tubs today.  Avoid strenuous activity today. You may resume your regular activities and exercise tomorrow.  Patients with diabetes may see an elevation in blood sugars for 7-10 days after the injection. It is important to pay close attention to your diet, check your blood sugars daily and report extreme elevations to the physician that treats your diabetes.  Patients taking a daily blood thinner can resume their regular dose this evening.  It is important that you take all medications ordered by your pain physician. Taking medication as ordered is an important part of your pain care plan. If you cannot continue the medication plan, please notify the physician.     Possible side effects to steroids that may occur:  Flushing or redness of the face  Irritability  Fluid retention  Change in women?s menses    The following medications were used: Lidocaine , Bupivicaine  , Depomedrol, and Contrast Dye

## 2022-04-11 ENCOUNTER — Encounter: Admit: 2022-04-11 | Discharge: 2022-04-11 | Payer: MEDICARE

## 2022-04-11 NOTE — Progress Notes
Lab orders faxed to [facility] outpatient lab per patient's request.

## 2022-04-21 ENCOUNTER — Encounter: Admit: 2022-04-21 | Discharge: 2022-04-21 | Payer: MEDICARE

## 2022-05-10 ENCOUNTER — Encounter: Admit: 2022-05-10 | Discharge: 2022-05-10 | Payer: MEDICARE

## 2022-05-10 DIAGNOSIS — D472 Monoclonal gammopathy: Secondary | ICD-10-CM

## 2022-05-19 ENCOUNTER — Encounter: Admit: 2022-05-19 | Discharge: 2022-05-19 | Payer: MEDICARE

## 2022-05-19 DIAGNOSIS — D472 Monoclonal gammopathy: Secondary | ICD-10-CM

## 2022-05-30 ENCOUNTER — Encounter: Admit: 2022-05-30 | Discharge: 2022-05-30 | Payer: MEDICARE

## 2022-06-22 ENCOUNTER — Encounter: Admit: 2022-06-22 | Discharge: 2022-06-22 | Payer: MEDICARE

## 2022-06-28 ENCOUNTER — Encounter: Admit: 2022-06-28 | Discharge: 2022-06-28 | Payer: MEDICARE

## 2022-06-28 DIAGNOSIS — M5416 Radiculopathy, lumbar region: Secondary | ICD-10-CM

## 2022-06-30 ENCOUNTER — Encounter: Admit: 2022-06-30 | Discharge: 2022-06-30 | Payer: MEDICARE

## 2022-06-30 ENCOUNTER — Ambulatory Visit: Admit: 2022-06-30 | Discharge: 2022-06-30 | Payer: MEDICARE

## 2022-06-30 DIAGNOSIS — N189 Chronic kidney disease, unspecified: Secondary | ICD-10-CM

## 2022-06-30 DIAGNOSIS — R12 Heartburn: Secondary | ICD-10-CM

## 2022-06-30 DIAGNOSIS — R0609 Other forms of dyspnea: Secondary | ICD-10-CM

## 2022-06-30 DIAGNOSIS — I00 Rheumatic fever without heart involvement: Secondary | ICD-10-CM

## 2022-06-30 DIAGNOSIS — K219 Gastro-esophageal reflux disease without esophagitis: Secondary | ICD-10-CM

## 2022-06-30 DIAGNOSIS — M48062 Spinal stenosis, lumbar region with neurogenic claudication: Secondary | ICD-10-CM

## 2022-06-30 DIAGNOSIS — IMO0002 Ulcer: Secondary | ICD-10-CM

## 2022-06-30 DIAGNOSIS — N429 Disorder of prostate, unspecified: Secondary | ICD-10-CM

## 2022-06-30 DIAGNOSIS — Z0389 Encounter for observation for other suspected diseases and conditions ruled out: Secondary | ICD-10-CM

## 2022-06-30 DIAGNOSIS — E785 Hyperlipidemia, unspecified: Secondary | ICD-10-CM

## 2022-06-30 DIAGNOSIS — M2550 Pain in unspecified joint: Secondary | ICD-10-CM

## 2022-06-30 DIAGNOSIS — M5416 Radiculopathy, lumbar region: Secondary | ICD-10-CM

## 2022-06-30 MED ORDER — IOHEXOL 240 MG IODINE/ML IV SOLN
1 mL | Freq: Once | EPIDURAL | 0 refills | Status: CP
Start: 2022-06-30 — End: ?
  Administered 2022-06-30: 21:00:00 1 mL via EPIDURAL

## 2022-06-30 MED ORDER — METHYLPREDNISOLONE ACETATE 80 MG/ML IJ SUSP
80 mg | Freq: Once | INTRAMUSCULAR | 0 refills | Status: CP
Start: 2022-06-30 — End: ?
  Administered 2022-06-30: 21:00:00 80 mg via INTRAMUSCULAR

## 2022-06-30 NOTE — Progress Notes

## 2022-06-30 NOTE — Patient Instructions
Procedure Completed Today: Lumbar Transforaminal Steroid Injection    Important information following your procedure today: You may drive today    Pain relief may not be immediate. It is possible you may even experience an increase in pain during the first 24-48 hours followed by a gradual decrease of your pain.  Though the procedure is generally safe and complications are rare, we do ask that you be aware of any of the following:   Any swelling, persistent redness, new bleeding, or drainage from the site of the injection.  You should not experience a severe headache.  You should not run a fever over 101? F.  New onset of sharp, severe back & or neck pain.  New onset of upper or lower extremity numbness or weakness.  New difficulty controlling bowel or bladder function after the injection.  New shortness of breath.    If any of these occur, please call to report this occurrence to Dr. Khan at [phone]. If you are calling after 4:00 p.m., on weekends or holidays please call [phone] and ask to have the resident physician on call for the physician paged or go to your local emergency room.  You may experience soreness at the injection site. Ice can be applied at 20 minute intervals. Avoid application of direct heat, hot showers or hot tubs today.  Avoid strenuous activity today. You may resume your regular activities and exercise tomorrow.  Patients with diabetes may see an elevation in blood sugars for 7-10 days after the injection. It is important to pay close attention to your diet, check your blood sugars daily and report extreme elevations to the physician that treats your diabetes.  Patients taking a daily blood thinner can resume their regular dose this evening.  It is important that you take all medications ordered by your pain physician. Taking medication as ordered is an important part of your pain care plan. If you cannot continue the medication plan, please notify the physician.     Possible side effects to steroids that may occur:  Flushing or redness of the face  Irritability  Fluid retention  Change in women?s menses    The following medications were used: Lidocaine , Depomedrol, and Contrast Dye

## 2022-07-15 ENCOUNTER — Encounter: Admit: 2022-07-15 | Discharge: 2022-07-15 | Payer: MEDICARE

## 2022-07-15 NOTE — Telephone Encounter
Received incoming VM from patient stating he has been trying unsuccessfully to make his follow up appointment with Dr. Jean Rosenthal in November. Would like to see if there are any availabilities.   Outbound call to pt. Offered 01/09/23 at 1030. Pt agreeable. Appointment created. Pt was appreciative of call.

## 2022-08-03 ENCOUNTER — Encounter: Admit: 2022-08-03 | Discharge: 2022-08-03 | Payer: MEDICARE

## 2022-08-12 ENCOUNTER — Encounter: Admit: 2022-08-12 | Discharge: 2022-08-12 | Payer: MEDICARE

## 2022-08-12 DIAGNOSIS — R404 Transient alteration of awareness: Secondary | ICD-10-CM

## 2022-08-15 ENCOUNTER — Encounter: Admit: 2022-08-15 | Discharge: 2022-08-15 | Payer: MEDICARE

## 2022-08-15 NOTE — Telephone Encounter
Spoke to patient- verified appointment and directions  Self referred- possible seizure  CT head requested and uploaded from [facility] in [place], [place]  Records requested-[facility]--spoke to [place] in med records  My chart active  Meds and allergies in O2
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
## 2022-08-24 ENCOUNTER — Ambulatory Visit: Admit: 2022-08-24 | Discharge: 2022-08-24 | Payer: MEDICARE

## 2022-08-24 ENCOUNTER — Encounter: Admit: 2022-08-24 | Discharge: 2022-08-24 | Payer: MEDICARE

## 2022-08-24 DIAGNOSIS — G4733 Obstructive sleep apnea (adult) (pediatric): Secondary | ICD-10-CM

## 2022-08-24 DIAGNOSIS — I38 Endocarditis, valve unspecified: Secondary | ICD-10-CM

## 2022-08-24 DIAGNOSIS — K859 Acute pancreatitis without necrosis or infection, unspecified: Secondary | ICD-10-CM

## 2022-08-24 DIAGNOSIS — M1990 Unspecified osteoarthritis, unspecified site: Secondary | ICD-10-CM

## 2022-08-24 DIAGNOSIS — R404 Transient alteration of awareness: Secondary | ICD-10-CM

## 2022-08-24 DIAGNOSIS — I00 Rheumatic fever without heart involvement: Secondary | ICD-10-CM

## 2022-08-24 DIAGNOSIS — Z0389 Encounter for observation for other suspected diseases and conditions ruled out: Secondary | ICD-10-CM

## 2022-08-24 DIAGNOSIS — K259 Gastric ulcer, unspecified as acute or chronic, without hemorrhage or perforation: Secondary | ICD-10-CM

## 2022-08-24 DIAGNOSIS — E785 Hyperlipidemia, unspecified: Secondary | ICD-10-CM

## 2022-08-24 DIAGNOSIS — K219 Gastro-esophageal reflux disease without esophagitis: Secondary | ICD-10-CM

## 2022-08-24 DIAGNOSIS — R0609 Other forms of dyspnea: Secondary | ICD-10-CM

## 2022-08-24 DIAGNOSIS — N400 Enlarged prostate without lower urinary tract symptoms: Secondary | ICD-10-CM

## 2022-08-24 DIAGNOSIS — N183 Chronic kidney disease, stage 3 (HCC): Secondary | ICD-10-CM

## 2022-08-24 NOTE — Progress Notes
65 minute EEG completed without complications.

## 2022-08-25 ENCOUNTER — Encounter: Admit: 2022-08-25 | Discharge: 2022-08-25 | Payer: MEDICARE

## 2022-08-25 DIAGNOSIS — M5416 Radiculopathy, lumbar region: Secondary | ICD-10-CM

## 2022-08-26 ENCOUNTER — Encounter: Admit: 2022-08-26 | Discharge: 2022-08-26 | Payer: MEDICARE

## 2022-08-26 NOTE — Telephone Encounter
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

## 2022-09-21 NOTE — Patient Instructions
It was a pleasure seeing you in clinic today.  Please don't hesitate to call or send a MyChart message if you have any questions.     DO NOT call or send a MyChart message asking for any test or imaging (MRI, CT, etc.) results.  All results will be discussed with you at your follow-up appointment.     Shanley Furlough BSN, RN, CNOR  Clinical Nurse Coordinator  Dr. Brandon Carlson  Alexsis Johnson APRN-NP   Jenna Batchelder PA  Caleb Darland PA  Troy Stucker PA  [facility]  [facility]  [address]  [place], [place] [address]  [email or web address]  Phone: [phone]  Fax:  [phone]  Scheduling [phone]

## 2022-10-04 ENCOUNTER — Encounter: Admit: 2022-10-04 | Discharge: 2022-10-04 | Payer: MEDICARE

## 2022-10-13 ENCOUNTER — Encounter: Admit: 2022-10-13 | Discharge: 2022-10-13 | Payer: MEDICARE

## 2022-10-13 ENCOUNTER — Ambulatory Visit: Admit: 2022-10-13 | Discharge: 2022-10-13 | Payer: MEDICARE

## 2022-10-13 DIAGNOSIS — N189 Chronic kidney disease, unspecified: Secondary | ICD-10-CM

## 2022-10-13 DIAGNOSIS — M5136 Other intervertebral disc degeneration, lumbar region: Secondary | ICD-10-CM

## 2022-10-13 DIAGNOSIS — K219 Gastro-esophageal reflux disease without esophagitis: Secondary | ICD-10-CM

## 2022-10-13 DIAGNOSIS — G4733 Obstructive sleep apnea (adult) (pediatric): Secondary | ICD-10-CM

## 2022-10-13 DIAGNOSIS — I38 Endocarditis, valve unspecified: Secondary | ICD-10-CM

## 2022-10-13 DIAGNOSIS — M1990 Unspecified osteoarthritis, unspecified site: Secondary | ICD-10-CM

## 2022-10-13 DIAGNOSIS — Z0389 Encounter for observation for other suspected diseases and conditions ruled out: Secondary | ICD-10-CM

## 2022-10-13 DIAGNOSIS — M5416 Radiculopathy, lumbar region: Secondary | ICD-10-CM

## 2022-10-13 DIAGNOSIS — M48061 Spinal stenosis, lumbar region without neurogenic claudication: Secondary | ICD-10-CM

## 2022-10-13 DIAGNOSIS — M2550 Pain in unspecified joint: Secondary | ICD-10-CM

## 2022-10-13 DIAGNOSIS — I00 Rheumatic fever without heart involvement: Secondary | ICD-10-CM

## 2022-10-13 DIAGNOSIS — E785 Hyperlipidemia, unspecified: Secondary | ICD-10-CM

## 2022-10-13 DIAGNOSIS — K259 Gastric ulcer, unspecified as acute or chronic, without hemorrhage or perforation: Secondary | ICD-10-CM

## 2022-10-13 DIAGNOSIS — M1611 Unilateral primary osteoarthritis, right hip: Secondary | ICD-10-CM

## 2022-10-13 DIAGNOSIS — K859 Acute pancreatitis without necrosis or infection, unspecified: Secondary | ICD-10-CM

## 2022-10-13 DIAGNOSIS — N400 Enlarged prostate without lower urinary tract symptoms: Secondary | ICD-10-CM

## 2022-10-13 DIAGNOSIS — Z9889 Other specified postprocedural states: Secondary | ICD-10-CM

## 2022-10-13 DIAGNOSIS — R0609 Other forms of dyspnea: Secondary | ICD-10-CM

## 2022-10-13 DIAGNOSIS — N183 Chronic kidney disease, stage 3 (HCC): Secondary | ICD-10-CM
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
## 2022-10-14 NOTE — Patient Education
Dear Joe Whitehead     Thank you for choosing [facility] Interventional Radiology for your procedure. Your appointment information is listed below:     You are scheduled for a Myelogram & hip injection with Dr. Bess Kinds on 8/23 at 1130 AM.   Please check into admission of the [facility] at 1030 and bring a driver.  [employer]: [address] [place], [place]  [address]  Parking: [address] [address] Garage        INTERVENTIONAL RADIOLOGY  PRE-PROCEDURE INSTRUCTIONS SEDATION     You are scheduled for a procedure in Interventional Radiology with procedural sedation.  Please follow these instructions and any direction from your Primary Care/Managing Physician.  If you have questions about your procedure or need to reschedule please call [phone].     Medication Instructions:   You may take your medications with a sip of water.     Diet Instructions:  At 330 AM on 8/23, (8) hours before your procedure, stop your regular diet and start a clear liquid diet.  At 530 AM on 8/23, (6) hours before your procedure, discontinue tube feedings and chewing tobacco.  At 930 AM on 8/23, (2) hours before your procedure discontinue clear liquids.  You should have nothing by mouth. This includes GUM or CANDY.      Clear Liquid Diet     Water                 Apple or White Grape Juice        Coffee or tea without cream   Tea                    White Cranberry Juice                 Chicken Bouillon or Broth (no noodles)  Soda Pop           Popsicles                                     Beef Bouillon or Broth (no noodles)     Day of Exam Instructions:  Bathe or shower with an antibacterial soap prior to your appointment.  If you have a history of Obstructive Sleep Apnea (OSA) bring your CPAP/BIPAP.   Bring a list of your current medications and the dosages.  Wear comfortable clothing and leave valuables at home.  Arrive (1) hour prior to your appointment.  This time will be spent registering, interviewing, assessing, educating and preparing you for the test.  You will be with [place] anywhere from 30 minutes to 6 hours after your exam depending on your procedure.  You may be sedated for the procedure. A responsible adult must drive you home (no Benedetto Goad, taxis or buses are allowed) and stay with you overnight. If you do not have a driver we will be unable to perform your procedure.   You will not be able to return to work or drive the same day if receiving sedation.

## 2022-10-28 ENCOUNTER — Encounter: Admit: 2022-10-28 | Discharge: 2022-10-28 | Payer: MEDICARE

## 2022-10-28 ENCOUNTER — Ambulatory Visit: Admit: 2022-10-28 | Discharge: 2022-10-28 | Payer: MEDICARE

## 2022-10-28 DIAGNOSIS — M48061 Spinal stenosis, lumbar region without neurogenic claudication: Secondary | ICD-10-CM

## 2022-10-28 DIAGNOSIS — M1611 Unilateral primary osteoarthritis, right hip: Secondary | ICD-10-CM

## 2022-10-28 DIAGNOSIS — M5136 Other intervertebral disc degeneration, lumbar region: Secondary | ICD-10-CM

## 2022-10-28 DIAGNOSIS — Z9889 Other specified postprocedural states: Secondary | ICD-10-CM

## 2022-10-28 LAB — CBC
HEMATOCRIT: 42 % (ref 40–50)
HEMOGLOBIN: 13 g/dL (ref 13.5–16.5)
MCH: 26 pg (ref 26–34)
MCHC: 32 g/dL (ref 32.0–36.0)
MCV: 82 FL (ref 80–100)
MPV: 7 FL (ref 7–11)
PLATELET COUNT: 232 10*3/uL (ref 150–400)
RBC COUNT: 5.1 M/UL (ref 4.4–5.5)
RDW: 14 % (ref 11–15)
WBC COUNT: 5.6 10*3/uL (ref 4.5–11.0)

## 2022-10-28 LAB — PROTIME INR (PT)
INR: 1 (ref 0.9–1.2)
PROTIME: 10 s (ref 10.2–12.9)

## 2022-10-28 MED ORDER — LIDOCAINE HCL 10 MG/ML (1 %) IJ SOLN
2 mL | Freq: Once | INTRAMUSCULAR | 0 refills | Status: CP
Start: 2022-10-28 — End: ?
  Administered 2022-10-28: 18:00:00 4 mL via INTRAMUSCULAR

## 2022-10-28 MED ORDER — MIDAZOLAM 1 MG/ML IJ SOLN
0 refills | Status: CP
Start: 2022-10-28 — End: ?

## 2022-10-28 MED ORDER — MIDAZOLAM 1 MG/ML IJ SOLN
1 mg | Freq: Once | INTRAVENOUS | 0 refills | Status: CP
Start: 2022-10-28 — End: ?
  Administered 2022-10-28: 18:00:00 1 mg via INTRAVENOUS

## 2022-10-28 MED ORDER — FLUMAZENIL 0.1 MG/ML IV SOLN
.2 mg | INTRAVENOUS | 0 refills | Status: DC | PRN
Start: 2022-10-28 — End: 2022-10-28

## 2022-10-28 MED ORDER — TRIAMCINOLONE ACETONIDE 40 MG/ML IJ SUSP
40 mg | Freq: Once | INTRAMUSCULAR | 0 refills | Status: CP
Start: 2022-10-28 — End: ?
  Administered 2022-10-28: 18:00:00 40 mg via INTRAMUSCULAR

## 2022-10-28 MED ORDER — FENTANYL CITRATE (PF) 50 MCG/ML IJ SOLN
0 refills | Status: CP
Start: 2022-10-28 — End: ?

## 2022-10-28 MED ORDER — IOPAMIDOL 300 MG IODINE /ML (61 %) IT SOLN
15 mL | Freq: Once | INTRATHECAL | 0 refills | Status: CP
Start: 2022-10-28 — End: ?
  Administered 2022-10-28: 19:00:00 15 mL via INTRATHECAL

## 2022-10-28 MED ORDER — SODIUM CHLORIDE 0.9 % IV SOLP
1000 mL | Freq: Once | INTRAVENOUS | 0 refills | Status: DC
Start: 2022-10-28 — End: 2022-10-28

## 2022-10-28 MED ORDER — NALOXONE 0.4 MG/ML IJ SOLN
.08 mg | INTRAVENOUS | 0 refills | Status: DC | PRN
Start: 2022-10-28 — End: 2022-10-28

## 2022-10-28 NOTE — H&P (View-Only)
IR Pre-Procedure History and Physical/Sedation Plan    Procedure Date: 10/28/2022     Planned Procedure(s):  T&L Myelogram and right hip injection    Procedural code status: FULL  Indication/Chief Complaint:  Thoracic disc herniation, hx of laminectomy; Hip pain   ________________________________________________________________________     History of Present Illness: Joe Whitehead is a 72 y.o. male with a history as listed below who presents today for procedure.    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    Cervical spinal stenosis 11/30/2020    Hyperlipemia 09/22/2016    GERD (gastroesophageal reflux disease) 09/22/2016    DOE (dyspnea on exertion) 09/22/2016    Observation for suspected cardiovascular disease 09/22/2016     Past Medical History:   Diagnosis Date    Arthritis     BPH (benign prostatic hyperplasia)     Chronic kidney disease     Mild stage 3 to stage 2    Chronic kidney disease, stage 3 (HCC)     DOE (dyspnea on exertion) 09/22/2016    Dyslipidemia     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  Normal LV sz and function.  Normal hyperdynamic response for all segments.  No Echocardiographic evidence of ischemia.      OSA (obstructive sleep apnea)     not done well with CPAP    Pancreatitis T07/19/2017    Dr Rod Mae -treated  me    Rheumatic fever 1960      Surgical History:   Procedure Laterality Date    SPINE SURGERY  1997    R L5-S1 microdiscectomy    Colonoscopy N/A 01/03/2019    Performed by Tempie Hoist, DO at [facility]    COLONOSCOPY WITH SNARE REMOVAL TUMOR/ POLYP/ OTHER LESION  01/03/2019    Performed by Tempie Hoist, DO at [facility] ENDO    COLONOSCOPY WITH BIOPSY - FLEXIBLE  01/03/2019    Performed by Tempie Hoist, DO at [facility] ENDO    LAMINECTOMY WITH EXPLORATION/ DECOMPRESSION SPINAL CORD / CAUDA EQUINA - 1-2 SEGMENTS - LUMBAR Lumbar 3 Laminectomy, Minimally Invasive Surgery N/A 06/11/2021    Performed by Pablo Chidester, MD at [facility] OR    COLONOSCOPY DIAGNOSTIC WITH SPECIMEN COLLECTION BY BRUSHING/ WASHING - FLEXIBLE N/A 11/04/2021    Performed by Tempie Hoist, DO at [facility] ENDO    COLONOSCOPY WITH SNARE REMOVAL TUMOR/ POLYP/ OTHER LESION  11/04/2021    Performed by Tempie Hoist, DO at [facility] ENDO    ARTHROPLASTY  07/13/2021    R knee arthroplasty    CLAVICLE SURGERY  13 and 17 years    X2 Left    ELBOW SURGERY  1974, 1989    HX JOINT REPLACEMENT  07/13/21    I am 1 week post op from a R knee arthroplasty    KNEE ARTHROSCOPY      bilateral    KNEE SURGERY  07/13/21    R knee arthroplasty    NERVE BIOPSY      ULNAR TUNNEL RELEASE Right  URETHRA SURGERY        Social History     Tobacco Use    Smoking status: Never    Smokeless tobacco: Never   Substance Use Topics    Alcohol use: Not Currently     Comment: I stopped drinking alcohol 09/23/2015      Family History   Problem Relation Name Age of Onset    COPD Mother [place]     Arthritis Mother [place]     Cancer Mother [place] S[place]         Breast Cancer    Osteoporosis Mother [place]     COPD Father Jerrian Burges     Neurologic Disorder Father Arty Gradillas         ALS-?    Arthritis Father Ronzell Cannizzaro         COPD and ALS?    Cancer Father Hester Sunshine         COPD/ALS?    Multiple sclerosis Brother Shawntez Calderone         He is currently uses a wheelchair    Cancer Brother Zair Casserly         MS now wheelchair bound    Neurologic Disorder Paternal Aunt          ALS    Multiple sclerosis Other Aunt Loraine Maple         Died from ALS    Diabetes Sister Rayetta Humphrey         Spinal stenosis surgery x 2 by Dr Cheryln Manly and multiple joint replacements    Seizures Neg Hx      Stroke Neg Hx      Migraines Neg Hx      Dementia Neg Hx        Medications Prior to Admission   Medication Sig Dispense Refill Last Dose    acetaminophen SR (TYLENOL) 650 mg tablet Take two tablets by mouth twice daily.   10/28/2022    docusate (COLACE) 100 mg capsule Take one capsule by mouth daily.   10/28/2022    ERGOCALCIFEROL (VITAMIN D2) (VITAMIN D PO) Take 2,000 Units by mouth daily.   10/28/2022    esomeprazole DR (NEXIUM) 20 mg capsule Take one capsule by mouth as Needed (1-2x every 3 weeks). Take on an empty stomach at least 1 hour before or 2 hours after food.       eszopiclone (LUNESTA) 3 mg tablet Take one tablet by mouth at bedtime as needed for Sleep.       ezetimibe (ZETIA) 10 mg tablet Take one tablet by mouth daily.   10/28/2022    mirabegron (MYRBETRIQ) 50 mg ER tablet Take one tablet by mouth daily.   10/28/2022     Allergies   Allergen Reactions    Crestor [Rosuvastatin] MUSCLE PAIN     aching muscles        Review of Systems  A comprehensive review of systems was negative.    Previous Personal Anesthetic/Sedation History:  Denies adverse events related to sedation/anesthesia.     Previous Family Anesthetic/Sedation History: Denies adverse events related to sedation/anesthesia.    Physical Exam:  Vital Signs: Last Filed In 24 Hours Vital Signs: 24 Hour Range   BP: 139/80 (08/23 1130)  Temp: 36.8 ?C (98.2 ?F) (08/23 1130)  Pulse: 67 (08/23 1130)  Respirations: 17 PER MINUTE (08/23 1130)  SpO2: 95 % (08/23 1130)  O2 Device: None (Room air) (08/23 1130) BP: (139)/(80)  Temp:  [36.8 ?C (98.2 ?F)]   Pulse:  [67]   Respirations:  [17 PER MINUTE]   SpO2:  [95 %]   O2 Device: None (Room air)          General appearance: Alert and no distress noted.  Neurologic: Grossly normal.  Lungs: Non labored at rest.  Heart: Regular rate and rhythm  Abdomen: Non-distended    Airway:  airway assessment performed  Mallampati II (soft palate, uvula, fauces visible)   Anesthesia Classification:  ASA III (A patient with a severe systemic disease that limits activity, but is not incapacitating)  Pre procedure anxiolysis plan: Midazolam  Intra-procedural Sedation/Medication Plan: Fentanyl, Lidocaine, and Midazolam  Personal history of sedation complications: Denies adverse event.   Family history of sedation complications: Denies adverse event.   Medications for Reversal: Naloxone and Flumazenil  Discussion/Reviews:  Physician has discussed risks and alternatives of this type of sedation and above planned procedures with patient  NPO Status: Acceptable  Pregnancy Status: N/A     Lab/Radiology/Other Diagnostic Tests:  Labs:  Hematology:    Lab Results   Component Value Date    HGB 13.8 10/28/2022    HCT 42.3 10/28/2022    PLTCT 232 10/28/2022    WBC 5.6 10/28/2022    NEUT 76.9 09/10/2020    NEUT 7.1 09/10/2020    LYMPH 1.5 09/10/2020    BASOPHILS 0.1 09/10/2020    MCV 82.0 10/28/2022    MCH 26.7 10/28/2022    MCHC 32.5 10/28/2022    MPV 7.0 10/28/2022    RDW 14.6 10/28/2022   , Coagulation:  No results found for: PT, PTT, INR, and General Chemistry:    Lab Results   Component Value Date    NA 139 01/12/2016    K 4.3 01/12/2016    CL 104 01/12/2016    CO2 26.0 01/12/2016    GAP 13 01/12/2016    BUN 21.0 09/10/2020    CR 1.21 09/10/2020    GLU 87 01/12/2016    GLU 93 05/29/2008    CA 9.9 01/12/2016    ALBUMIN 3.4 09/18/2015    TOTBILI 0.50 09/18/2015              Wesley Blas, APRN-NP  Pager [phone]

## 2022-10-28 NOTE — Progress Notes
Pt arrived to IR pre/post for procedure work up.     Mobility status: Ambulatory with cane  Procedure reviewed and pt verbalized understanding.   Pt cart locked in low position, call light within reach.   See doc flowsheets for further details.    Will continue to monitor patient.

## 2022-10-28 NOTE — Progress Notes
Pt recovery complete.     Ambulatory status: Wheelchair- Transfers without Assist .   Vitals stable. Dressing is clean, dry, and intact.  Pain level returned to baseline.  Discharge instructions reviewed with: Patient and wife  AVS provided to patient.

## 2022-10-28 NOTE — Unmapped
Immediate Post Procedure Note    Date:  10/28/2022                                       Performing Provider:  Bing Matter, M.D.    Procedure(s): Right hip steroid injection, thoracic and lumbar myelogram  Pre/Post Procedure Diagnosis: Right hip pain, back pain       Anesthesia: Sedation    Findings: See radiology report  Specimen(s) Removed: None    Estimated Blood Loss:  None/Negligible  Complications: None  Time out performed: Consent obtained, correct patient verified, correct procedure verified, correct site verified, patient marked as necessary.      Bing Matter, M.D.  Neurointerventional Radiology

## 2022-10-28 NOTE — Progress Notes
Sedation physician present in room. Recent vitals and patient condition reviewed between sedating physician and nurse. Reassessment completed. Determination made to proceed with planned sedation.

## 2022-11-03 ENCOUNTER — Encounter: Admit: 2022-11-03 | Discharge: 2022-11-03 | Payer: MEDICARE

## 2022-11-03 NOTE — Progress Notes
Interventional Radiology Progress Note    Follow up call placed s/p R hip injection and myelogram last week. Patient states his R hip is 90-95% better, he no longer needs a cane to ambulate, and his gait is almost back to normal. Patient very pleased with results. Patient is in PT for some mild L hip discomfort that rest and PT seem to be helping.  Patient also inquired about myelogram results/next steps.    Instructed patient to call our office if L hip pain does not continue to improve, or R hop pain returns. Also advised patient to f/u with Dr. Preston Fleeting office re: myelogram questions. Patient VU.     Charlean Sanfilippo, RN

## 2022-11-13 ENCOUNTER — Encounter: Admit: 2022-11-13 | Discharge: 2022-11-13 | Payer: MEDICARE

## 2022-12-01 ENCOUNTER — Encounter: Admit: 2022-12-01 | Discharge: 2022-12-01 | Payer: MEDICARE

## 2022-12-01 ENCOUNTER — Ambulatory Visit: Admit: 2022-12-01 | Discharge: 2022-12-02 | Payer: MEDICARE

## 2022-12-01 DIAGNOSIS — M1611 Unilateral primary osteoarthritis, right hip: Secondary | ICD-10-CM

## 2022-12-01 DIAGNOSIS — M48061 Spinal stenosis, lumbar region without neurogenic claudication: Secondary | ICD-10-CM

## 2022-12-01 DIAGNOSIS — M5416 Radiculopathy, lumbar region: Secondary | ICD-10-CM

## 2022-12-01 DIAGNOSIS — Z9889 Other specified postprocedural states: Secondary | ICD-10-CM

## 2022-12-01 DIAGNOSIS — M5136 Other intervertebral disc degeneration, lumbar region: Secondary | ICD-10-CM

## 2022-12-01 NOTE — Progress Notes
Joe A. Clydene Pugh, MD [facility]  Follow - Up Visit  Subjective     REASON FOR VISIT   Pain of the Right Hip, Pain of the Lower Back (Right lower back and right SI joint will have pain when lying down in bed), and Follow Up (CT myelogram review and hip injection - 80-90% relief from injection, starting to wear off at about 50% now)    SUBJECTIVE     Seen today via telehealth to review CT myelogram as well as his injection results.    Had a hip injection.  This gave him 90% relief of his hip pain.  He is very happy with this result.  It seems to be tapering off a little bit however, he is pretty in tune with this pain and he knows that he can repeat it in the future if he needs to.  He thinks it is really helped his functional status.  Continues to have some pain likely emanating from his spinal stenosis lumbar region.  No reported balance issues or signs or symptoms of myelopathy.       ROS: Review of Systems   Musculoskeletal:  Positive for arthralgias and back pain.   All other systems reviewed and are negative.    A 10-point ROS was performed and negative.    PHYSICAL EXAM   There were no vitals taken for this visit.  There is no height or weight on file to calculate BMI.  Oswestry Total Score:: 26  Pain Score: One    Constitutional: Alert, NAD  Head: Atraumatic  Eyes: EOMI  Respiratory: Unlabored breathing  Mobilizing on camera without difficulty.  Appears to be motor intact.      RADIOGRAPHS         IR MYELOGRAM  Narrative: Thoracic and lumbar spine myelogram, CT thoracic and lumbar myelogram    Clinical History: Back pain    Contrast: Isovue-300 M    Medications: 2% Lidocaine    Comparison Study: MRI lumbar spine 10/26/2021    Technique: The risks and benefits were discussed and informed consent was obtained.    The patient was positioned prone and their back was prepped and draped in the usual sterile fashion. Lidocaine 2% was used for local anesthesia posterior to the L2-L3 level. A 3.5, 22-gauge spinal needle was then advanced into the thecal sac under fluoroscopic guidance with prompt return of clear CSF. Following this, 12 ml of Isovue-300 contrast was injected under fluoroscopic guidance. Following this, the needle was removed and a sterile dressing was applied. Patient was then placed in the Trendelenburg position to migrate the contrast from the lumbar thecal sac into the thoracic thecal sac. Once this was accomplished, spot radiographs were then obtained. This included upright flexion and extension lumbar radiographs. Patient was then transferred to the CT scanner and multiple axial scans of the thoracic and lumbar spine were obtained with sagittal and coronal reformats.    Findings:    Thoracic and lumbar spine myelogram: There is appropriate opacification of the lumbar and thoracic thecal sac. There is a notable circumferential stenosis at L2-3 and to a milder degree L1-2. Some paucity of contrast seen in the upper thoracic thecal sac.    CT thoracic myelogram: The thoracic vertebral body heights are normally maintained. Mild S-shaped thoracolumbar curvature is noted. There is moderate to severe narrowing of all of the thoracic disc spaces with associated degenerative endplate changes which include cystic degenerative endplate changes, sclerotic changes, and gaseous degeneration of the  disks. At T11-T12, there is a partially calcified central and right paracentral disc protrusion. Right-sided facet arthrosis also noted. This results in some moderate central spinal stenosis. No other levels of high-grade central spinal stenosis or foraminal stenosis in the thoracic spine.    Soft tissues are within normal limits.    CT lumbar myelogram: Lumbar vertebral body heights are normally maintained. There is moderate narrowing of the lumbar disc spaces with degenerative endplate changes similar to that seen in the thoracic spine. L5 is sacralized which is a normal variant.    L1-2: Mild annular disc bulge resulting in mild trefoil central spinal stenosis and mild foraminal narrowing.    L2-3: Mild degenerative disc bulge with ligamentum flavum hypertrophy results in severe trefoil central spinal stenosis with lateral recess stenosis. Mild foraminal narrowing.    L3-4: Mild facet arthrosis and annular disc bulge. Minimal central spinal stenosis with mild left foraminal narrowing.    L4-5: Mild facet arthrosis and annular disc bulge. Minimal central spinal stenosis. Mild to moderate bilateral foraminal narrowing.    L5-S1: No spinal stenosis or significant foraminal narrowing.  Impression: Thoracic spine:  1. 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  2. T11-12 moderate central spinal stenosis from partially calcified central and right paracentral disc protrusion.    Lumbar spine:  1. L2-3 severe trefoil central spinal stenosis and lateral recess stenosis. Findings are similar to prior MRI.  2. Multilevel degenerative disc disease similar to what was noted in the thoracic spine.  3. L4-5 moderate foraminal narrowing.  4. 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    I, Jasmine Pang, M.D., the attending interventional radiologist, performed the entire procedure, personally reviewed the images, and formulated the interpretations and opinions expressed in this report.    @TT      Finalized by Judi Saa, M.D. on 10/28/2022 4:06 PM. Dictated by Judi Saa, M.D. on 10/28/2022 3:19 PM.  CT L-SPINE W CONTRAST  Narrative: Thoracic and lumbar spine myelogram, CT thoracic and lumbar myelogram    Clinical History: Back pain    Contrast: Isovue-300 M    Medications: 2% Lidocaine    Comparison Study: MRI lumbar spine 10/26/2021    Technique: The risks and benefits were discussed and informed consent was obtained.    The patient was positioned prone and their back was prepped and draped in the usual sterile fashion. Lidocaine 2% was used for local anesthesia posterior to the L2-L3 level. A 3.5, 22-gauge spinal needle was then advanced into the thecal sac under fluoroscopic guidance with prompt return of clear CSF. Following this, 12 ml of Isovue-300 contrast was injected under fluoroscopic guidance. Following this, the needle was removed and a sterile dressing was applied. Patient was then placed in the Trendelenburg position to migrate the contrast from the lumbar thecal sac into the thoracic thecal sac. Once this was accomplished, spot radiographs were then obtained. This included upright flexion and extension lumbar radiographs. Patient was then transferred to the CT scanner and multiple axial scans of the thoracic and lumbar spine were obtained with sagittal and coronal reformats.    Findings:    Thoracic and lumbar spine myelogram: There is appropriate opacification of the lumbar and thoracic thecal sac. There is a notable circumferential stenosis at L2-3 and to a milder degree L1-2. Some paucity of contrast seen in the upper thoracic thecal sac.    CT thoracic myelogram: The thoracic vertebral body heights are normally maintained. Mild S-shaped thoracolumbar curvature is noted. There is moderate  to severe narrowing of all of the thoracic disc spaces with associated degenerative endplate changes which include cystic degenerative endplate changes, sclerotic changes, and gaseous degeneration of the disks. At T11-T12, there is a partially calcified central and right paracentral disc protrusion. Right-sided facet arthrosis also noted. This results in some moderate central spinal stenosis. No other levels of high-grade central spinal stenosis or foraminal stenosis in the thoracic spine.    Soft tissues are within normal limits.    CT lumbar myelogram: Lumbar vertebral body heights are normally maintained. There is moderate narrowing of the lumbar disc spaces with degenerative endplate changes similar to that seen in the thoracic spine. L5 is sacralized which is a normal variant.    L1-2: Mild annular disc bulge resulting in mild trefoil central spinal stenosis and mild foraminal narrowing.    L2-3: Mild degenerative disc bulge with ligamentum flavum hypertrophy results in severe trefoil central spinal stenosis with lateral recess stenosis. Mild foraminal narrowing.    L3-4: Mild facet arthrosis and annular disc bulge. Minimal central spinal stenosis with mild left foraminal narrowing.    L4-5: Mild facet arthrosis and annular disc bulge. Minimal central spinal stenosis. Mild to moderate bilateral foraminal narrowing.    L5-S1: No spinal stenosis or significant foraminal narrowing.  Impression: Thoracic spine:  1. 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  2. T11-12 moderate central spinal stenosis from partially calcified central and right paracentral disc protrusion.    Lumbar spine:  1. L2-3 severe trefoil central spinal stenosis and lateral recess stenosis. Findings are similar to prior MRI.  2. Multilevel degenerative disc disease similar to what was noted in the thoracic spine.  3. L4-5 moderate foraminal narrowing.  4. 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    I, Jasmine Pang, M.D., the attending interventional radiologist, performed the entire procedure, personally reviewed the images, and formulated the interpretations and opinions expressed in this report.    @TT      Finalized by Judi Saa, M.D. on 10/28/2022 4:06 PM. Dictated by Judi Saa, M.D. on 10/28/2022 3:19 PM.  CT T-SPINE W CONTRAST  Narrative: Thoracic and lumbar spine myelogram, CT thoracic and lumbar myelogram    Clinical History: Back pain    Contrast: Isovue-300 M    Medications: 2% Lidocaine    Comparison Study: MRI lumbar spine 10/26/2021    Technique: The risks and benefits were discussed and informed consent was obtained.    The patient was positioned prone and their back was prepped and draped in the usual sterile fashion. Lidocaine 2% was used for local anesthesia posterior to the L2-L3 level. A 3.5, 22-gauge spinal needle was then advanced into the thecal sac under fluoroscopic guidance with prompt return of clear CSF. Following this, 12 ml of Isovue-300 contrast was injected under fluoroscopic guidance. Following this, the needle was removed and a sterile dressing was applied. Patient was then placed in the Trendelenburg position to migrate the contrast from the lumbar thecal sac into the thoracic thecal sac. Once this was accomplished, spot radiographs were then obtained. This included upright flexion and extension lumbar radiographs. Patient was then transferred to the CT scanner and multiple axial scans of the thoracic and lumbar spine were obtained with sagittal and coronal reformats.    Findings:    Thoracic and lumbar spine myelogram: There is appropriate opacification of the lumbar and thoracic thecal sac. There is a notable circumferential stenosis at L2-3 and to a milder degree L1-2. Some paucity of contrast seen in  the upper thoracic thecal sac.    CT thoracic myelogram: The thoracic vertebral body heights are normally maintained. Mild S-shaped thoracolumbar curvature is noted. There is moderate to severe narrowing of all of the thoracic disc spaces with associated degenerative endplate changes which include cystic degenerative endplate changes, sclerotic changes, and gaseous degeneration of the disks. At T11-T12, there is a partially calcified central and right paracentral disc protrusion. Right-sided facet arthrosis also noted. This results in some moderate central spinal stenosis. No other levels of high-grade central spinal stenosis or foraminal stenosis in the thoracic spine.    Soft tissues are within normal limits.    CT lumbar myelogram: Lumbar vertebral body heights are normally maintained. There is moderate narrowing of the lumbar disc spaces with degenerative endplate changes similar to that seen in the thoracic spine. L5 is sacralized which is a normal variant.    L1-2: Mild annular disc bulge resulting in mild trefoil central spinal stenosis and mild foraminal narrowing.    L2-3: Mild degenerative disc bulge with ligamentum flavum hypertrophy results in severe trefoil central spinal stenosis with lateral recess stenosis. Mild foraminal narrowing.    L3-4: Mild facet arthrosis and annular disc bulge. Minimal central spinal stenosis with mild left foraminal narrowing.    L4-5: Mild facet arthrosis and annular disc bulge. Minimal central spinal stenosis. Mild to moderate bilateral foraminal narrowing.    L5-S1: No spinal stenosis or significant foraminal narrowing.  Impression: Thoracic spine:  1. 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  2. T11-12 moderate central spinal stenosis from partially calcified central and right paracentral disc protrusion.    Lumbar spine:  1. L2-3 severe trefoil central spinal stenosis and lateral recess stenosis. Findings are similar to prior MRI.  2. Multilevel degenerative disc disease similar to what was noted in the thoracic spine.  3. L4-5 moderate foraminal narrowing.  4. 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    I, Jasmine Pang, M.D., the attending interventional radiologist, performed the entire procedure, personally reviewed the images, and formulated the interpretations and opinions expressed in this report.    @TT      Finalized by Judi Saa, M.D. on 10/28/2022 4:06 PM. Dictated by Judi Saa, M.D. on 10/28/2022 3:19 PM.  IR FACET INJECTION  Narrative: Right hip injection    CLINICAL HISTORY: Right hip pain, osteoarthritis    COMPARISON STUDY: None    MEDICATIONS: 40 mg Kenalog (1 mL) with 3 mL 1% lidocaine    PROCEDURE: Risks and benefits are discussed and informed consent was obtained.    The patient's right hip was prepped and draped in usual sterile fashion. The right leg and foot positioned in a slightly medial orientation. Lidocaine 2% was used for local anesthesia. Under direct fluoroscopic guidance, a 22-gauge spinal needle was advanced into the right hip joint at the junction of the femoral head and neck imaging towards the junction of the upper one third and lower two thirds of the femoral neck. A small amount of Isovue-300 contrast was injected opacifying the joint capsule. From this position, the steroid and lidocaine mixture was injected slowly. During injection, patient experienced some right hip fullness, but no significant pain. The needle was removed and a sterile dressing was applied.  Impression: Impression: Successful fluoroscopic-guided right hip injection    I, Jasmine Pang, M.D., the attending interventional radiologist, performed the entire procedure, personally reviewed the images, and formulated the interpretations and opinions expressed in this report.    @TT   Finalized by Judi Saa, M.D. on 10/28/2022 3:19 PM. Dictated by Judi Saa, M.D. on 10/28/2022 3:15 PM.            ASSESSMENT / PLAN     Joe Whitehead is a 72 y.o. male with     1. History of lumbar laminectomy        2. DDD (degenerative disc disease), lumbar        3. Spinal stenosis of lumbar region with radiculopathy        4. Primary osteoarthritis of right hip        5. Stenosis of lateral recess of lumbar spine        6. Lumbar radiculopathy            After reviewing the CT myelogram we discussed the signs and symptoms of neurogenic claudication once again.  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  I think that if he ever wanted to pursue a redo decompression of the L3-4 segment to really get his stenosis addressed I think that would be possible.  He is gena give it some thought.  He might undergo a hip arthroplasty for the hip pain prior to this.  I think that that is reasonable.  He will call our clinic if he wishes to proceed with anything surgical.  At that time we would readdress the T12-L1 area of stenosis in the disc herniation and determine whether or not it would be necessary to decompress that area as well.    Portions of this record were generated with Dragon, a dictation software. There may be typos or grammatical errors. Please contact my office for any documentation clarifications.           Joe Pfannenstiel B. Lisette Grinder, MD, MPH  Spinal Surgery  [place]. Clydene Pugh MD, [facility]  Nurse: Laverle Patter, BSN, RN, CNOR   [phone]  -  [email or web address]

## 2022-12-02 ENCOUNTER — Encounter: Admit: 2022-12-02 | Discharge: 2022-12-02 | Payer: MEDICARE

## 2022-12-07 ENCOUNTER — Encounter: Admit: 2022-12-07 | Discharge: 2022-12-07 | Payer: MEDICARE

## 2022-12-13 ENCOUNTER — Encounter: Admit: 2022-12-13 | Discharge: 2022-12-13 | Payer: MEDICARE

## 2022-12-13 ENCOUNTER — Ambulatory Visit: Admit: 2022-12-13 | Discharge: 2022-12-14 | Payer: MEDICARE

## 2022-12-13 DIAGNOSIS — G4733 Obstructive sleep apnea (adult) (pediatric): Secondary | ICD-10-CM

## 2022-12-13 DIAGNOSIS — K859 Acute pancreatitis without necrosis or infection, unspecified: Secondary | ICD-10-CM

## 2022-12-13 DIAGNOSIS — I00 Rheumatic fever without heart involvement: Secondary | ICD-10-CM

## 2022-12-13 DIAGNOSIS — I38 Endocarditis, valve unspecified: Secondary | ICD-10-CM

## 2022-12-13 DIAGNOSIS — M2550 Pain in unspecified joint: Secondary | ICD-10-CM

## 2022-12-13 DIAGNOSIS — K219 Gastro-esophageal reflux disease without esophagitis: Secondary | ICD-10-CM

## 2022-12-13 DIAGNOSIS — K259 Gastric ulcer, unspecified as acute or chronic, without hemorrhage or perforation: Secondary | ICD-10-CM

## 2022-12-13 DIAGNOSIS — R0609 Other forms of dyspnea: Secondary | ICD-10-CM

## 2022-12-13 DIAGNOSIS — M1990 Unspecified osteoarthritis, unspecified site: Secondary | ICD-10-CM

## 2022-12-13 DIAGNOSIS — E785 Hyperlipidemia, unspecified: Secondary | ICD-10-CM

## 2022-12-13 DIAGNOSIS — N189 Chronic kidney disease, unspecified: Secondary | ICD-10-CM

## 2022-12-13 DIAGNOSIS — N183 Chronic kidney disease, stage 3 (HCC): Secondary | ICD-10-CM

## 2022-12-13 DIAGNOSIS — Z0389 Encounter for observation for other suspected diseases and conditions ruled out: Secondary | ICD-10-CM

## 2022-12-13 DIAGNOSIS — N400 Enlarged prostate without lower urinary tract symptoms: Secondary | ICD-10-CM
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
## 2022-12-14 DIAGNOSIS — M1990 Unspecified osteoarthritis, unspecified site: Secondary | ICD-10-CM

## 2022-12-14 DIAGNOSIS — G4733 Obstructive sleep apnea (adult) (pediatric): Secondary | ICD-10-CM

## 2023-01-10 ENCOUNTER — Ambulatory Visit: Admit: 2023-01-10 | Discharge: 2023-01-11 | Payer: MEDICARE

## 2023-01-20 ENCOUNTER — Encounter: Admit: 2023-01-20 | Discharge: 2023-01-20 | Payer: MEDICARE

## 2023-01-20 ENCOUNTER — Ambulatory Visit: Admit: 2023-01-20 | Discharge: 2023-01-21 | Payer: MEDICARE

## 2023-01-20 DIAGNOSIS — Z8601 History of colon polyps: Secondary | ICD-10-CM

## 2023-01-23 ENCOUNTER — Encounter: Admit: 2023-01-23 | Discharge: 2023-01-23 | Payer: MEDICARE

## 2023-01-25 ENCOUNTER — Encounter: Admit: 2023-01-25 | Discharge: 2023-01-25 | Payer: MEDICARE

## 2023-01-25 DIAGNOSIS — R0902 Hypoxemia: Secondary | ICD-10-CM

## 2023-01-25 DIAGNOSIS — G4733 Obstructive sleep apnea (adult) (pediatric): Secondary | ICD-10-CM

## 2023-01-25 NOTE — Telephone Encounter
-----   Message from Verlee Rossetti, MD sent at 01/23/2023  9:19 AM CST -----  Regarding: New DME order  Hi Fumiko Cham,    This is a new CPAP start. Patient's home study shows severe OSA and hypoxemia. Please expedite order below:     ResMed AirSense 11 Auto-Set, 7-20 cm H20, heated tubing, humidifier, filters, mask per patient choice, all other necessary equipment. Patient would like to try DreamWear nasal style mask with use of chin strap if needed. Please include 1 night of nocturnal oximetry monitoring while using APAP at the prescribed pressure settings.    Please schedule follow up 4-6 weeks after setup.    Thank you.

## 2023-03-10 ENCOUNTER — Encounter: Admit: 2023-03-10 | Discharge: 2023-03-10 | Payer: MEDICARE

## 2023-03-10 NOTE — Telephone Encounter
Requests pressures be narrowed for comfort. He uses Flonase and saline, yet continues to struggle with congestion during initiation of CPAP at night. This can prevent him from falling asleep. Utilizes Lunesta 3 mg 3-4 times weekly PRN. He does find APAP and FFM more comforable and notes his therapy tolerance has improved. He uses his water chamber and finds the heated tubing agreeable. Has trialed ramp time anywhere from 10-40 minutes but believes the quicker the better and finds the auto setting best, but would appreciate any recommendations to help him get past the hump of trying to fall asleep amidst the congestion.

## 2023-03-10 NOTE — Telephone Encounter
DAYS USED 17 / 30     DAYS USED 4+ HOURS 17/30     AVG DAILY USAGE (DAYS USED) 7 hours and 43 min    AVG AHI 7.7    AVG CAI 2.0    % TIME IN CSR 0.0     MEDIAN PRESSURE 11.1    95% PRESSURE 15.8     95% LEAK 16.5

## 2023-04-04 ENCOUNTER — Encounter: Admit: 2023-04-04 | Discharge: 2023-04-04 | Payer: MEDICARE

## 2023-04-27 ENCOUNTER — Encounter: Admit: 2023-04-27 | Discharge: 2023-04-27 | Payer: MEDICARE

## 2023-04-27 NOTE — Telephone Encounter
 DAYS USED 30 / 30     DAYS USED 4+ HOURS 30/30     AVG DAILY USAGE (DAYS USED) 7 hours and 1 min    AVG AHI 6.0    AVG CAI 1.4    % TIME IN CSR 0.0     MEDIAN PRESSURE 10.5     95% PRESSURE 14.1     95% LEAK 17.6

## 2023-05-03 ENCOUNTER — Encounter: Admit: 2023-05-03 | Discharge: 2023-05-03 | Payer: MEDICARE

## 2023-05-04 ENCOUNTER — Encounter: Admit: 2023-05-04 | Discharge: 2023-05-04 | Payer: MEDICARE

## 2023-05-04 ENCOUNTER — Ambulatory Visit: Admit: 2023-05-04 | Discharge: 2023-05-05 | Payer: MEDICARE

## 2023-05-04 DIAGNOSIS — G4719 Other hypersomnia: Secondary | ICD-10-CM

## 2023-05-04 DIAGNOSIS — G4733 Obstructive sleep apnea (adult) (pediatric): Secondary | ICD-10-CM

## 2023-05-04 NOTE — Telephone Encounter
-----   Message from Albina Billet, [place] sent at 05/04/2023  1:19 PM CST -----  Regarding: dme  Hi Kaslyn Richburg,    Please update the patient's DME order to reflect the following changes, which have already been made on [employer]:  APAP 10-16 cm H2O.     He also needs to have 1 night of nocturnal oximetry completed. This was previously ordered by JL but not yet sent to patient.       Thank you,    Boneta Lucks

## 2023-05-04 NOTE — Progress Notes
 Telehealth Visit Note  Date of Service: 05/04/2023    Subjective:             Joe Whitehead is a 73 y.o. male.    Past Medical History:    Arthritis    BPH (benign prostatic hyperplasia)    Chronic kidney disease    Chronic kidney disease, stage 3 (HCC)    DOE (dyspnea on exertion)    Dyslipidemia    Endocarditis    Gastric ulcer    GERD (gastroesophageal reflux disease)    Joint pain    Observation for suspected cardiovascular disease    OSA (obstructive sleep apnea)    Pancreatitis    Rheumatic fever         History of Present Illness  Chief Complaint   Patient presents with    Sleep Problem     CPAP doing good        Joe Whitehead presents today via telehealth for follow-up. He was last seen by Dr. Threasa Heads on 12/13/22 for OSA management. At that time a home sleep study was ordered.     Home Sleep Test (01/10/23):  TST 425 minutes. Sleep efficiency 85%.  AHI4% 29.8, supine AHI4% 50.8, non-supine AHI4% 24.7.   AHI3% 42.5, supine AHI3% 74.7, non-supine AHI3% 34.8.   Baseline oxygen saturation 93.7%, oxygen saturation minimum 75%, time below 88% 28 minutes with 5.7 minutes < 85%. +snoring   IMPRESSION: severe OSA        Interval History:   He was then started on APAP 7-20 cm H2O. Acclimating well to this. Has previously failed PAP therapy twice in the past.   Using FFM, happy with fit of this. Has to tighten mask several times throughout the night as the pressure increases.   Has nasal congestion for the first 8-10 minutes after starting machine. Ramp time on auto (10 minutes), starting pressure of 4.   Denies pressure intolerance, aerophagia, or bothersome dryness.     1 night of nocturnal oximetry was also ordered but has not been completed.     Had hip replacement about 1 month ago.     Feels more alert & awake since starting APAP but still falling asleep while typing.       Bedtime: 10:30-11pm   Sleep onset latency: within 15 minutes   Awakenings: 2x (nocturia, pain)  Wake time: 6:30am   Naps: depends on activity level, does not use CPAP  Bed partner: yes (wife), no reports of residual snoring with CPAP    Preferred sleep position: side     Epworth Sleepiness Scale Score: (Patient-Rptd) 11 (Compared to a score of 10/24 at LOV.)  Drowsy driving? Improved since starting CPAP     STOPBANG Score: 5  If score > 3 there is a high probability that they have OSA.       Depression Screening was performed on Joe Whitehead in clinic today. Based on the score of 0, no follow up action or recommendations are necessary at this time.    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                Objective:         acetaminophen SR (TYLENOL) 650 mg tablet Take two tablets by mouth twice daily.    docusate (COLACE) 100 mg capsule Take one capsule by mouth daily.    ERGOCALCIFEROL (VITAMIN D2) (VITAMIN D PO) Take 2,000 Units by mouth daily.    esomeprazole DR (NEXIUM) 20 mg capsule Take  one capsule by mouth as Needed (1-2x every 3 weeks). Take on an empty stomach at least 1 hour before or 2 hours after food.    eszopiclone (LUNESTA) 3 mg tablet Take one tablet by mouth at bedtime as needed for Sleep.    ezetimibe (ZETIA) 10 mg tablet Take one tablet by mouth daily.    HYDROcodone/acetaminophen (NORCO) 7.5/325 mg tablet every 12 hours as needed.    mirabegron (MYRBETRIQ) 50 mg ER tablet Take one tablet by mouth daily.       Telehealth Body Mass Index: [phone] at 05/04/2023  1:32 PM     Physical Exam  Alert, no acute distress. Pleasant and cooperative with history and evaluation.          Assessment and Plan:  Problem   Excessive Daytime Sleepiness   Osa (Obstructive Sleep Apnea)    Split night sleep study at [facility] on 08/12/11.   Sleep latency 2 minutes.  REM latency 86 minutes.     Baseline AHI 6, RDI 10/hr, SpO2 average 96%, SpO2 min 86%, and 1 minute < 90%.   No isolated or periodic limb movements were noted.   The patient was noted to have difficulty tolerating PAP machines. At a Bilevel pressure of 12/8 cm H20, he slept for 34 minutes, 7 minutes REM sleep and AHI/RDI was 2. Bilevel 12/8 cm H20 was recommended.       Home Sleep Test (01/10/23):  TST 425 minutes. Sleep efficiency 85%.  AHI4% 29.8, supine AHI4% 50.8, non-supine AHI4% 24.7.   AHI3% 42.5, supine AHI3% 74.7, non-supine AHI3% 34.8.   Baseline oxygen saturation 93.7%, oxygen saturation minimum 75%, time below 88% 28 minutes with 5.7 minutes < 85%. +snoring   IMPRESSION: severe OSA           OSA (obstructive sleep apnea)          Joe Whitehead is compliant with and experiences symptomatic benefit from PAP therapy. There is improved control of his sleep apnea on current settings with a residual AHI < 6.0. Will plan to further optimize therapy by narrowing pressures to 10-16 cm H2O. Will also increase starting ramp pressure from 4 to 6cm H2O to help reduce nasal congestion. He is satisfied with his mask interface and denies symptoms of pressure intolerance.    One night of nocturnal oximetry was also ordered when he started PAP therapy but has not been completed. Will have nurse contact DME provider to facilitate this asap.   Various aspects of APAP usage and compliance were discussed with patient. He was advised to use PAP therapy all night, every night, including with naps and travel.   Weight loss should be pursued in the long term management of this patient's sleep disordered breathing.    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 Residual AHI slightly elevated at 6.0/hr. Planning to narrow pressure window to 10-16 cm H2O. Will reassess AHI in 4 months at follow-up appointment. If daytime sleepiness continues despite adequate control of OSA, may consider adding wake-promoting agent, such a low-dose modafinil.   The risks associated with driving or operating heavy machinery while sleepy were discussed with the patient, who understands and agrees to take measures to eliminate these risks.

## 2023-05-04 NOTE — Assessment & Plan Note
 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 Residual AHI slightly elevated at 6.0/hr. Planning to narrow pressure window to 10-16 cm H2O. Will reassess AHI in 4 months at follow-up appointment. If daytime sleepiness continues despite adequate control of OSA, may consider adding wake-promoting agent, such a low-dose modafinil.

## 2023-05-04 NOTE — Patient Instructions
 Sleep Medicine nurse team:  Tacey Ruiz   [phone]  Neysa Bonito   [phone]    Fax: [phone]

## 2023-05-05 ENCOUNTER — Encounter: Admit: 2023-05-05 | Discharge: 2023-05-05 | Payer: MEDICARE

## 2023-05-10 ENCOUNTER — Encounter: Admit: 2023-05-10 | Discharge: 2023-05-10 | Payer: MEDICARE

## 2023-05-12 ENCOUNTER — Encounter: Admit: 2023-05-12 | Discharge: 2023-05-12 | Payer: MEDICARE

## 2023-05-12 DIAGNOSIS — D472 Monoclonal gammopathy: Secondary | ICD-10-CM

## 2023-05-12 NOTE — Progress Notes
 name: Joe Whitehead          MRN: [id number]      DOB: January 08, 1951      AGE: 73 y.o.   DATE OF SERVICE: 05/12/2023    Subjective:             Reason for Visit:  Heme/Onc Care      Joe Whitehead is a 73 y.o. male.         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 Ala Dach.  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  Labs performed including SPEP.  This revealed paraprotein with M spike of 0.34 leading to hematology consultation..    Labs from July 2022 show M spike of 0.34 on serum protein electrophoresis.  Immunofixation consistent with IgG kappa.  Creatinine normal  at 1.21.  Hemoglobin 15.8 with hematocrit of 50.  Mild increase in neutrophils.  B12 and folate normal.  Labs performed through Dr. Herschell Dimes 09/10/20 showed normal immunoglobulins and normal light chains.  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 Andreas Newport seen in follow-up.  He is unaccompanied for today's visit.  Multiple orthopedic surgeries over the last few years.  He has had right shoulder, knee and hip replacement.  He had ABLA with his most recent surgery when his hemoglobin went down to 8.7 but has since recovered.  Recent labs with PSA elevation.  Working with his urologist.  Previously diagnosed with BPH.  Will be starting finasteride.  No constitutional symptoms.       Review of Systems   Constitutional:  Negative for chills, fatigue and fever.   Respiratory:  Negative for cough, shortness of breath and wheezing.    Cardiovascular:  Negative for chest pain and palpitations.   Gastrointestinal:  Negative for abdominal pain, diarrhea, nausea and vomiting.   Musculoskeletal:  Positive for back pain.   Neurological:  Positive for numbness. Negative for dizziness and light-headedness.         Objective:          acetaminophen SR (TYLENOL) 650 mg tablet Take two tablets by mouth twice daily.    docusate (COLACE) 100 mg capsule Take one capsule by mouth daily.    ERGOCALCIFEROL (VITAMIN D2) (VITAMIN D PO) Take 2,000 Units by mouth daily.    esomeprazole DR (NEXIUM) 20 mg capsule Take one capsule by mouth as Needed (1-2x every 3 weeks). Take on an empty stomach at least 1 hour before or 2 hours after food.    eszopiclone (LUNESTA) 3 mg tablet Take one tablet by mouth at bedtime as needed for Sleep.    ezetimibe (ZETIA) 10 mg tablet Take one tablet by mouth daily.    HYDROcodone/acetaminophen (NORCO) 7.5/325 mg tablet every 12 hours as needed.    mirabegron (MYRBETRIQ) 50 mg ER tablet Take one tablet by mouth daily.     Vitals:    05/12/23 1151   BP: (!) 146/63   BP Source: Arm, Right Upper   Pulse: 75   Temp: 36.6 ?C (97.8 ?F)   Resp: 18   SpO2: 100%   TempSrc: Temporal   PainSc: One   Weight: 114.5 kg (252 lb 6.4 oz)     Body mass index is 35.2 kg/m?Marland Kitchen     Pain Score: One  Pain Loc: Hip (Right)    Fatigue Scale: 0-None    Pain  Addressed:  Current regimen working to control pain.    Patient Evaluated for a Clinical Trial: Patient not eligible for a treatment trial (including not needing treatment, needs palliative care, in remission).     [place] Cooperative Oncology Group performance status is 1, Restricted in physically strenuous activity but ambulatory and able to carry out work of a light or sedentary nature, e.g., light house work, office work.     Physical Exam  Constitutional:       General: He is not in acute distress.  HENT:      Head: Normocephalic.   Eyes:      General: No scleral icterus.     Extraocular Movements: Extraocular movements intact.   Neurological:      Mental Status: He is alert and oriented to person, place, and time.   Psychiatric:         Mood and Affect: Mood normal.         Behavior: Behavior normal.         Thought Content: Thought content normal.         Judgment: Judgment normal.          09/10/2020: IgG 1054, IgA 175, IgM 107  Kappa 14.1, lambda 12.3, ratio 1.15.    09/23/2021:  IgG 1020, IgM 97, IgA 166  SPEP with M spike of 0.2 , 0.4, there were 2 bands.  No immunofixation available  Kappa 21, lambda 17.5, ratio 1.20.  CMP with creatinine of 1.36, normal calcium levels at 9.5 (fluctuating creatinine at baseline per Dr. Gilles Chiquito report)  CBC with hemoglobin of 14.2     04/19/2022: Calcium, creatinine normal.  Hemoglobin 13.5.    Immunoglobulin levels normal.    SPEP with M spike of 0.2 and 0.3.  IgG kappa monoclonal band on IFE  Kappa 18.5, lambda 7.4, ratio 1.11 May 2023: Labs at [facility].  Hg 12.1  Ca 8.7  Scr 1.15   IgG 915, IgA 121, IgM 90.  Kappa 15.8, lambda 18.1, ratio 0.87.  All of them normal.  SPEP with 2 restricted M spike in the gamma region measuring 0.2 and 0.3.         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  Reviewed recent labs.  Overall stable labs with no evidence of progression.  Natural history of MGUS, risk stratification based on the paraprotein, surveillance all reviewed with him in great detail and all questions addressed.  Will plan for annual labs.  Return precautions also reviewed.    He has shoulder replacement recently and ABLA with Hg down to 8.7.  Hemoglobin has since improved  to 12.1.    PSA elevation noted on labs from March 2025.  Previous workup with his urologist was consistent with BPH.  He plans to start finasteride.  Continue follow-ups with his urologist.    RTC in 1 year or sooner as needed.  Parts of this note were created with voice recognition software. Please excuse any grammatical or typographical errors.

## 2023-06-05 ENCOUNTER — Encounter: Admit: 2023-06-05 | Discharge: 2023-06-05

## 2023-06-06 ENCOUNTER — Encounter: Admit: 2023-06-06 | Discharge: 2023-06-06

## 2023-06-06 ENCOUNTER — Ambulatory Visit: Admit: 2023-06-06 | Discharge: 2023-06-07

## 2023-06-06 DIAGNOSIS — M48062 Spinal stenosis, lumbar region with neurogenic claudication: Secondary | ICD-10-CM

## 2023-06-06 DIAGNOSIS — M4802 Spinal stenosis, cervical region: Secondary | ICD-10-CM

## 2023-06-06 DIAGNOSIS — Z9889 Other specified postprocedural states: Secondary | ICD-10-CM

## 2023-06-06 DIAGNOSIS — M150 Primary generalized (osteo)arthritis: Secondary | ICD-10-CM

## 2023-06-06 NOTE — Patient Instructions
 May try Tylenol Extra Strength up to 1 g 3x per day  May continue glucosamine/chondroitin sulfate supplements  Voltaren gel up to 4x per day as needed for smaller joint pains  May continue Ultram sparingly as needed  PT for the low back pain  Will see you back as needed
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
## 2023-06-07 ENCOUNTER — Encounter: Admit: 2023-06-07 | Discharge: 2023-06-07

## 2023-06-19 ENCOUNTER — Encounter: Admit: 2023-06-19 | Discharge: 2023-06-19 | Payer: MEDICARE

## 2023-06-19 DIAGNOSIS — G4733 Obstructive sleep apnea (adult) (pediatric): Secondary | ICD-10-CM

## 2023-06-19 DIAGNOSIS — R0902 Hypoxemia: Secondary | ICD-10-CM

## 2023-06-20 ENCOUNTER — Encounter: Admit: 2023-06-20 | Discharge: 2023-06-20 | Payer: MEDICARE

## 2023-07-05 ENCOUNTER — Encounter: Admit: 2023-07-05 | Discharge: 2023-07-05 | Payer: MEDICARE

## 2023-07-05 DIAGNOSIS — G4733 Obstructive sleep apnea (adult) (pediatric): Secondary | ICD-10-CM

## 2023-07-05 NOTE — Telephone Encounter
 DAYS USED 30 / 30     DAYS USED 4+ HOURS 30/30     AVG DAILY USAGE (DAYS USED) 7 hours and 22 min    AVG AHI 7.6    AVG CAI 2.4    % TIME IN CSR 0.1     MEDIAN PRESSURE 12.4     95% PRESSURE 15.0     95% LEAK 11.5

## 2023-07-11 ENCOUNTER — Encounter: Admit: 2023-07-11 | Discharge: 2023-07-11 | Payer: MEDICARE

## 2023-08-02 ENCOUNTER — Encounter: Admit: 2023-08-02 | Discharge: 2023-08-02 | Payer: MEDICARE

## 2023-08-02 DIAGNOSIS — G4733 Obstructive sleep apnea (adult) (pediatric): Secondary | ICD-10-CM

## 2023-08-02 NOTE — Telephone Encounter
 DAYS USED 24 / 24    DAYS USED 4+ HOURS 24/24     AVG DAILY USAGE (DAYS USED) 7 hours and 6 min    AVG AHI 6.9    AVG CAI 1.3    % TIME IN CSR 0.0     SET PRESSURE 14.0     95% LEAK 24.4

## 2023-09-01 ENCOUNTER — Encounter: Admit: 2023-09-01 | Discharge: 2023-09-01 | Payer: MEDICARE

## 2023-09-01 NOTE — Telephone Encounter
 DAYS USED 30 / 30     DAYS USED 4+ HOURS 30/30     AVG DAILY USAGE (DAYS USED) 6 hours/72min     AVG AHI 7.1    AVG CAI 1.0    % TIME IN CSR 0.2     SET PRESSURE 15.0     95% LEAK 44.9

## 2023-09-04 ENCOUNTER — Encounter: Admit: 2023-09-04 | Discharge: 2023-09-04 | Payer: MEDICARE

## 2023-09-04 ENCOUNTER — Ambulatory Visit: Admit: 2023-09-04 | Discharge: 2023-09-05 | Payer: MEDICARE

## 2023-09-04 DIAGNOSIS — G4733 Obstructive sleep apnea (adult) (pediatric): Principal | ICD-10-CM

## 2023-09-04 NOTE — Progress Notes
 Date of Service: 09/04/2023    Subjective:             Joe Whitehead is a 73 y.o. male.    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     Interval History    Using CPAP nightly.   Occasional issues with distilled water when traveling.   Headgear was stretching which may have impacted leak, but picked up new supplies today.   Also trying to put mask on more snug.  Wearing full face mask.   No pressure intolerance or aerophagia.   Feels comfortable with his current pressure settings.   Gets some benefit. More alert during the day.   More active during day. Plays golf.   Changed tubing settings to auto and he finds this more comfortable.     Sleeping 6.5 to 7 hours.     May fall asleep in chair watching television.     No sleep related behaviors or amnesia.     Losing weight on Zepbound.            09/01/2023     4:10 PM 05/03/2023    10:18 PM 12/06/2022     9:48 PM   Epworth Sleepiness Scale   Sitting and reading 1 1 1    Watching TV 0 2 2   Sitting inactive in a public place (e.g. a theater or a meeting) 0 1 0   As a passenger in a car for an hour without a break 1 2 2    Lying down to rest in the afternoon when circumstances permit 2 3 2    Sitting and talking to someone 0 0 0   Sitting quietly after a lunch without alcohol 1 1 2    In a car, while stopped for a few minutes in traffic 0 1 1   Epworth Sleepiness Scale Score 5  11  10        Patient-reported   He denies drowsy driving.       Objective:         acetaminophen  SR (TYLENOL ) 650 mg tablet Take two tablets by mouth twice daily. (Patient taking differently: Take 1,000 mg by mouth twice daily.)    docusate (COLACE) 100 mg capsule Take one capsule by mouth daily.    ERGOCALCIFEROL (VITAMIN D2) (VITAMIN D PO) Take 2,000 Units by mouth daily.    esomeprazole DR (NEXIUM) 20 mg capsule Take one capsule by mouth as Needed (1-2x every 3 weeks). Take on an empty stomach at least 1 hour before or 2 hours after food.    eszopiclone (LUNESTA) 3 mg tablet Take one tablet by mouth at bedtime as needed for Sleep.    ezetimibe  (ZETIA ) 10 mg tablet Take one tablet by mouth daily. (Patient not taking: Reported on 09/04/2023)    finasteride (PROSCAR) 5 mg tablet Take one tablet by mouth daily.    HYDROcodone/acetaminophen  (NORCO) 7.5/325 mg tablet every 12 hours as needed.    mirabegron (MYRBETRIQ) 50 mg ER tablet Take one tablet by mouth daily.   On eszopiclone once a week, taking it less than he used to. Managed by primary care physician.   Takes Norco once every few days in the morning. Never takes at night.     Vitals:    09/04/23 1523   BP: 115/65   Pulse: 69   SpO2: 95%   Weight: 107 kg (236 lb)   Height: 179.1 cm (5'  10.5)     Body mass index is 33.38 kg/m?[name]     Physical Exam  General appearance: Pleasant. No acute distress.   Ears, Nose, and Throat: Mallampati IV. Overbite. Class I occlusion.   Cardiovascular: Regular rate and rhythm.   Lungs: Clear to auscultation bilaterally.   Neurological: Alert, attentive, cooperative. Normal language output and comprehension.   Psychiatric: Normal mood and affect.        Assessment and Plan:    Osa (Obstructive Sleep Apnea)     Split night sleep study at [facility] on 08/12/11.   Sleep latency 2 minutes.  REM latency 86 minutes.     Baseline AHI 6, RDI 10/hr, SpO2 average 96%, SpO2 min 86%, and 1 minute < 90%.   No isolated or periodic limb movements were noted.   The patient was noted to have difficulty tolerating PAP machines. At a Bilevel pressure of 12/8 cm H20, he slept for 34 minutes, 7 minutes REM sleep and AHI/RDI was 2. Bilevel 12/8 cm H20 was recommended.      Home Sleep Test (01/10/23):  TST 425 minutes. Sleep efficiency 85%.  AHI4% 29.8, supine AHI4% 50.8, non-supine AHI4% 24.7.   AHI3% 42.5, supine AHI3% 74.7, non-supine AHI3% 34.8.   Baseline oxygen saturation 93.7%, oxygen saturation minimum 75%, time below 88% 28 minutes with 5.7 minutes < 85%. +snoring   IMPRESSION: severe OSA          Severe OSA on CPAP    Reviewed download in detail.     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 Residual AHI is mildly elevated in the setting of elevated mask leak. Facial hair may also be impacting seal.      Various aspects of CPAP usage and compliance were discussed with patient. He was advised to use CPAP during any sleep period.    He recently replaced his PAP interface supplies and he will try trimming facial hair. Recheck download in 2 months. If leak remains elevated, patient may need a new mask fitting.       Discussed with patient potential for eszopiclone to exacerbate sleep disordered breathing. He notes that he has been able to reduce usage lately.      The risks associated with driving or operating heavy machinery while sleepy were discussed with the patient, who understands and agrees to take measures to eliminate these risks.                   Return to clinic in 1 year, sooner if needed.

## 2023-09-11 ENCOUNTER — Ambulatory Visit: Admit: 2023-09-11 | Discharge: 2023-09-11 | Payer: MEDICARE

## 2023-09-11 ENCOUNTER — Encounter: Admit: 2023-09-11 | Discharge: 2023-09-11 | Payer: MEDICARE

## 2023-09-20 ENCOUNTER — Encounter: Admit: 2023-09-20 | Discharge: 2023-09-20 | Payer: MEDICARE

## 2023-09-20 NOTE — Telephone Encounter
 09/20/23 - Records are being requested from Pcp, Dr. Ethel Bruns, (F) [phone], (P) [phone]. kl

## 2023-09-25 ENCOUNTER — Encounter: Admit: 2023-09-25 | Discharge: 2023-09-25 | Payer: MEDICARE

## 2023-09-25 DIAGNOSIS — M47816 Spondylosis without myelopathy or radiculopathy, lumbar region: Secondary | ICD-10-CM

## 2023-09-25 DIAGNOSIS — M5416 Radiculopathy, lumbar region: Secondary | ICD-10-CM

## 2023-09-25 DIAGNOSIS — M48062 Spinal stenosis, lumbar region with neurogenic claudication: Principal | ICD-10-CM

## 2023-10-05 ENCOUNTER — Encounter: Admit: 2023-10-05 | Discharge: 2023-10-05 | Payer: MEDICARE

## 2023-10-06 ENCOUNTER — Encounter: Admit: 2023-10-06 | Discharge: 2023-10-06 | Payer: MEDICARE

## 2023-10-10 ENCOUNTER — Encounter: Admit: 2023-10-10 | Discharge: 2023-10-10 | Payer: MEDICARE

## 2023-10-10 DIAGNOSIS — R931 Abnormal findings on diagnostic imaging of heart and coronary circulation: Principal | ICD-10-CM

## 2023-10-10 DIAGNOSIS — Z0389 Encounter for observation for other suspected diseases and conditions ruled out: Secondary | ICD-10-CM

## 2023-10-10 NOTE — Progress Notes
 URGENT    Request for the following medical records from [facility] for purpose of continuity of care. Patient has an upcoming cardiology appointment.     Patient: Joe Whitehead  DOB:  1950/05/25    Please Fax:    Most recent lipid results (any since 05/08/2023)  Most recent full lab results, including Comprehensive Metabolic Panel (CMP); A1C etc  results    Please fax to: [phone]  Cardiology Services at the [facility]  Attention: Medical Records/Dr Quin    Thank you

## 2023-10-10 NOTE — Progress Notes
 CARDIAC NEW PATIENT PROFILE    PHYSICIANS INFORMATION:   REFERRING PHYSICIAN: Self referral   PCP: Dorn Bruns, MD- [facility]    OTHER PROVIDERS:    Delon Man, MD- Pulmonary Clinic   Suzen Saddler, MD- [facility]   Shelda Ling, MD- Oncology   Donald Flair, APRN-NP- GI Clinic   Penne Rubenstein, MD- Orthopedic Surgery/Spine Center    REASON FOR VISIT/DIAGNOSIS: Re-establishing with cardiology. Last seen by Dr Fayette in July 2018    RECENT EVENTS/SYMPTOMS:     09/27/2016- Patient seen by Dr Fayette for evaluation of exertional shortness of breath and ankle edema. Per note, patient has not been diagnosed with coronary artery disease or even hypertension. Dr Fayette discussed starting Aspirin  325 mg for prevention of colon cancer and noted patient with history of colon polyps. Dr Fayette also recommended starting Crestor . Treadmill Exercise Echo was ordered for symptoms of dyspnea on exertion. Stress Echo done on 10/11/2016 at [facility]. No evidence of ischemia.     Patient follows with above specialty clinics. Medical history below    08/21/2023- Patient seen by Dr Bruns. No cardiac symptoms of chest pain/shortness of breath. Per note, no cardiovascular disease. It is noted patient had a cardiac calcium score of 43 in the past and that patient continues to struggle with medications for cholesterol. Per note, he couldn't tolerate Simvastatin, Crestory, Lipitor or Pravastatin. Also, couldn't tolerate Zetia . Dr Bruns notes that patient plans to see Dr Marcey Balloon to discuss. 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 Dr Bruns ordered an Echo and Repeat CT Cardiac Calcium score. Results available.[name]    Appears most recent lipids done 05/08/2023. LDL-89; HDL-59. Do not have recent CMP results. Will request from Dr Bruns.     PERTINENT CARDIAC HISTORY: dyslipidemia; elevated coronary artery calcium score; endocarditis; rheumatic fever    OTHER MEDICAL HISTORY: MGUS; GERD; OSA; CKD; colon polyps; DDD; cervical spinal stenosis; lumbar stenosis; arthritis; osteoarthritis;  back pain; pancreatitis; gastric ulcer; BPH; seasonal allergies; ulnar neuropathy    MOST RECENT PERTINENT TESTING/PROCEDURES:    Date/  Facility Test/  Procedure Results Summary  (Please reference original report for full results)   10/11/2016 at [facility]  Exercise Echo Normal sinus rhythm. Normal left ventricular size and systolic function with normal wall thickness art rest. No ECG evidence of ischemia. No echocardiographic evidence of ischemia.     Report location: Outside records (book marked)     09/11/2023 at [facility] CT Cardiac Calcium Score Total calcium score is 80 (LAD=56; RCA=24). The calcium score on the piror study was 43.     Report Location: Outside records (book marked)     09/11/2023 at [facility]  ECHO Normal left ventricular cavitary dimensions with normal geometry. Normal left ventricular systolic function with a visually estimated ejection fraction of ~T5-6 %. Mildly dilated right ventricular cavitary size with normal RV systolic function. Normal left ventricular diastolic function.  Normal left atrial pressure. No left ventricular focal regional wall motion abnormality. Mild right atrial enlargement. No hemodynamically significant valvular stenosis or regurgitation. No obvious intracardiac vegetation or thrombus. Suboptimal TR jet precludes definitive assessment of PA systolic pressure. No pericardial effusion.    Report Link:2D + DOPPLER ECHO (09/11/2023 11:18)           PERTINENT SURGERIES: Surgical history on [facility] chart. No cardiac related surgeries noted.     MEDICATIONS/ALLERGIES: On chart from internal specialty clinic visits.     LAB:  Cholesterol   Date Value Ref Range Status   05/08/2023 167  Final     Triglycerides   Date Value Ref Range Status   05/08/2023 96  Final     HDL   Date Value Ref Range Status   05/08/2023 59  Final     LDL   Date Value Ref Range Status   05/08/2023 89  Final     Hemoglobin A1C   Date Value Ref Range Status   04/01/2016 5.0  Final        RECORDS: Faxed request to Dr Erby office for any further lab/CMP results.  (Ph: [phone]; fax: [phone])    Chart book marked

## 2023-10-12 ENCOUNTER — Encounter: Admit: 2023-10-12 | Discharge: 2023-10-12 | Payer: MEDICARE

## 2023-10-12 ENCOUNTER — Ambulatory Visit: Admit: 2023-10-12 | Discharge: 2023-10-12 | Payer: MEDICARE

## 2023-10-12 ENCOUNTER — Ambulatory Visit: Admit: 2023-10-12 | Discharge: 2023-10-13 | Payer: MEDICARE

## 2023-10-12 DIAGNOSIS — M47816 Spondylosis without myelopathy or radiculopathy, lumbar region: Secondary | ICD-10-CM

## 2023-10-12 DIAGNOSIS — M48062 Spinal stenosis, lumbar region with neurogenic claudication: Principal | ICD-10-CM

## 2023-10-12 DIAGNOSIS — M5416 Radiculopathy, lumbar region: Secondary | ICD-10-CM

## 2023-10-12 MED ORDER — METHYLPREDNISOLONE ACETATE 80 MG/ML IJ SUSP
80 mg | Freq: Once | INTRAMUSCULAR | 0 refills | Status: CP
Start: 2023-10-12 — End: ?
  Administered 2023-10-12: 15:00:00 80 mg via INTRAMUSCULAR

## 2023-10-12 MED ORDER — IOHEXOL 240 MG IODINE/ML IV SOLN
1 mL | Freq: Once | EPIDURAL | 0 refills | Status: CP
Start: 2023-10-12 — End: ?
  Administered 2023-10-12: 15:00:00 1 mL via EPIDURAL

## 2023-10-12 NOTE — Procedures
 Attending Surgeon: Royston LELON Bathe, MD    Anesthesia: Local      [facility] TRANSFORAMINAL LUMBAR/SACRAL THERAPEUTIC  Procedure: transforaminal epidural    Laterality: right   on 10/12/2023 9:15 AM  Location: lumbar - L2-3      Consent:   Consent obtained: written  Consent given by: patient    Discussed with patient the purpose of the treatment/procedure, other ways of treating my condition, including no treatment/ procedure and the risks and benefits of the alternatives. Patient has decided to proceed with treatment/procedure.   Procedures Details:   Indications: pain   Prep: chlorhexidine  Patient position: prone  Estimated Blood Loss: minimal  Specimens: none  Number of Levels: 1  Approach: right paramedian  Guidance: fluoroscopy  Contrast: Procedure confirmed with contrast under live fluoroscopy.  Needle and Epidural Catheter: pencil-tip  Needle size: 25 G  Injection procedure: Incremental injection, Negative aspiration for blood and Introduced needle  Patient tolerance: Patient tolerated the procedure well with no immediate complications. Pressure was applied, and hemostasis was accomplished.  Outcome: Pain relieved and Pain improved  Comments: 1ml lido and 80mg  depomedrol inj  This patient's clinical history, exam, AND imaging support radiculopathy AND there is a significant impact on quality of life and function AND the pain has been present for at least 4 weeks AND they have failed to improve with noninvasive conservative care.  This patient had at least 50% pain relief for at least 3 months with the last epidural injection.        Estimated blood loss: none or minimal  Specimens: none  Patient tolerated the procedure well with no immediate complications. Pressure was applied, and hemostasis was accomplished.

## 2023-10-12 NOTE — Progress Notes
 SPINE CENTER  INTERVENTIONAL PAIN PROCEDURE HISTORY AND PHYSICAL    Chief Complaint: Pain    HISTORY OF PRESENT ILLNESS:  rec back and ;leg pain    Past Medical History:    Arthritis    Back pain    BPH (benign prostatic hyperplasia)    Chronic kidney disease    Chronic kidney disease, stage 3 (CMS-HCC)    Degenerative disc disease, cervical    Degenerative disc disease, lumbar    Degenerative disc disease, thoracic    DOE (dyspnea on exertion)    Dyslipidemia    Endocarditis    Gastric ulcer    GERD (gastroesophageal reflux disease)    Joint pain    Nerve injury    Observation for suspected cardiovascular disease    OSA (obstructive sleep apnea)    Osteoarthritis    Other and unspecified hyperlipidemia    Pancreatitis    Rheumatic fever    Seasonal allergic reaction    Skin cancer    Spinal stenosis       Surgical History:   Procedure Laterality Date    SPINE SURGERY  1997    R L5-S1 microdiscectomy    Colonoscopy N/A 01/03/2019    Performed by Leonce Emerick PARAS, DO at [facility] ENDO    COLONOSCOPY WITH SNARE REMOVAL TUMOR/ POLYP/ OTHER LESION  01/03/2019    Performed by Leonce Emerick PARAS, DO at [facility] ENDO    COLONOSCOPY WITH BIOPSY - FLEXIBLE  01/03/2019    Performed by Leonce Emerick PARAS, DO at [facility] ENDO    LAMINECTOMY WITH EXPLORATION/ DECOMPRESSION SPINAL CORD / CAUDA EQUINA - 1-2 SEGMENTS - LUMBAR Lumbar 3 Laminectomy, Minimally Invasive Surgery N/A 06/11/2021    Performed by Nicholaus Eva DASEN, MD at [facility] OR    COLONOSCOPY DIAGNOSTIC WITH SPECIMEN COLLECTION BY BRUSHING/ WASHING - FLEXIBLE N/A 11/04/2021    Performed by Leonce Emerick PARAS, DO at [facility] ENDO    COLONOSCOPY WITH SNARE REMOVAL TUMOR/ POLYP/ OTHER LESION  11/04/2021    Performed by Leonce Emerick PARAS, DO at [facility] ENDO    ARTHROPLASTY  07/13/2021    R knee arthroplasty    CLAVICLE SURGERY  13 and 17 years    X2 Left    ELBOW SURGERY  1974, 1989    EYE SURGERY  11/24    Blepharplasty    HX JOINT REPLACEMENT  07/13/21    I am 1 week post op from a R knee arthroplasty    HX VASECTOMY  1995    KNEE ARTHROSCOPY      bilateral    KNEE SURGERY  07/13/21    R knee arthroplasty    NERVE BIOPSY      PROSTATE SURGERY  2019    H/O TUR and Urolift    SHOULDER SURGERY  01/21/22 and again  04/23/22    R shoulder replacement    SURGERY  05/21/21    Dr Nicholaus BRADY    ULNAR TUNNEL RELEASE Right     URETHRA SURGERY         family history includes Arthritis in his father and mother; COPD in his father and mother; Cancer in his brother, father, and mother; Diabetes in his sister; Multiple sclerosis in his brother and another family member; Neurologic Disorder in his father and paternal aunt; Osteoporosis in his mother.    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  I continue alcohol free    Drug use: Never    Sexual activity: Yes     Partners: Female     Birth control/protection: Post-menopausal, None   Social History Narrative    Lives with wife       Allergies[1]    Vitals:    10/12/23 0919   BP: 127/83   BP Source: Arm, Left Upper   Pulse: 72   Temp: 36.6 ?C (97.8 ?F)   SpO2: 96%   TempSrc: Oral   PainSc: Two  Comment: worse with stair mobility; and first laying in bed   Weight: 104.3 kg (230 lb)   Height: 180.3 cm (5' 11)     Pain Score: Two (worse with stair mobility; and first laying in bed)  Oswestry Total Score:: (Patient-Rptd) 28    REVIEW OF SYSTEMS: 10 point ROS obtained and negative except pain      PHYSICAL EXAM:unchanged        IMPRESSION:    1. Lumbar stenosis with neurogenic claudication    2. Lumbar spondylosis    3. Lumbar radiculopathy         PLAN: Lumbar Transforaminal Steroid Injection               [1]   Allergies  Allergen Reactions Crestor  [Rosuvastatin ] MUSCLE PAIN     aching muscles

## 2023-10-12 NOTE — Progress Notes

## 2023-10-12 NOTE — Patient Instructions
 Procedure Completed Today: Lumbar Transforaminal Steroid Injection    Important information following your procedure today: You may drive today    Pain relief may not be immediate. It is possible you may even experience an increase in pain during the first 24-48 hours followed by a gradual decrease of your pain.  Though the procedure is generally safe and complications are rare, we do ask that you be aware of any of the following:   Any swelling, persistent redness, new bleeding, or drainage from the site of the injection.  You should not experience a severe headache.  You should not run a fever over 101? F.  New onset of sharp, severe back & or neck pain.  New onset of upper or lower extremity numbness or weakness.  New difficulty controlling bowel or bladder function after the injection.  New shortness of breath.    If any of these occur, please call to report this occurrence to Dr. Welton Flakes at [phone]. If you are calling after 4:00 p.m., on weekends or holidays please call [phone] and ask to have the resident physician on call for the physician paged or go to your local emergency room.  You may experience soreness at the injection site. Ice can be applied at 20 minute intervals. Avoid application of direct heat, hot showers or hot tubs today.  Avoid strenuous activity today. You may resume your regular activities and exercise tomorrow.  Patients with diabetes may see an elevation in blood sugars for 7-10 days after the injection. It is important to pay close attention to your diet, check your blood sugars daily and report extreme elevations to the physician that treats your diabetes.  Patients taking a daily blood thinner can resume their regular dose this evening.  It is important that you take all medications ordered by your pain physician. Taking medication as ordered is an important part of your pain care plan. If you cannot continue the medication plan, please notify the physician.     Possible side effects to steroids that may occur:  Flushing or redness of the face  Irritability  Fluid retention  Change in women?s menses    The following medications were used: Depomedrol and Contrast Dye

## 2023-10-23 ENCOUNTER — Encounter: Admit: 2023-10-23 | Discharge: 2023-10-23 | Payer: MEDICARE

## 2023-10-24 ENCOUNTER — Encounter: Admit: 2023-10-24 | Discharge: 2023-10-24 | Payer: MEDICARE

## 2023-10-24 ENCOUNTER — Ambulatory Visit: Admit: 2023-10-24 | Discharge: 2023-10-24 | Payer: MEDICARE

## 2023-10-24 DIAGNOSIS — Z0389 Encounter for observation for other suspected diseases and conditions ruled out: Principal | ICD-10-CM

## 2023-10-24 DIAGNOSIS — G4733 Obstructive sleep apnea (adult) (pediatric): Secondary | ICD-10-CM

## 2023-10-24 DIAGNOSIS — I2510 Atherosclerotic heart disease of native coronary artery without angina pectoris: Secondary | ICD-10-CM

## 2023-10-24 DIAGNOSIS — E66812 Class 2 severe obesity due to excess calories with serious comorbidity and body mass index (BMI) of 36.0 to 36.9 in adult (CMS-HCC): Secondary | ICD-10-CM

## 2023-10-24 MED ORDER — REPATHA SURECLICK 140 MG/ML SC PNIJ
140 mg | SUBCUTANEOUS | 0 refills | 28.00000 days | Status: AC
Start: 2023-10-24 — End: ?

## 2023-10-24 MED ORDER — ASPIRIN 81 MG PO TBEC
81 mg | Freq: Every day | ORAL | 0 refills | 30.00000 days | Status: AC
Start: 2023-10-24 — End: ?

## 2023-10-24 NOTE — Assessment & Plan Note
 Zepbound is working well for [employer] with ~25 pound weight loss so far.  His goal is a weight of around 200 pounds.

## 2023-10-24 NOTE — Assessment & Plan Note
 We discussed the implications of coronary calcium and the strategy of plaque stabilization by lowering LDL.  He has a supply of Repatha  and I encouraged him to start the medication with a goal of LDL < 70 or lower.  I placed an order for a lipid profile in about 3 months.  I don't think he's having any symptoms that would prompt stress testing at this time.

## 2023-10-24 NOTE — Progress Notes
 Date of Service: 10/24/2023    Joe Whitehead is a 73 y.o. male.       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  He's overweight and just started Zepbound with about 27 pound weight loss so far.    He's been treated for OSA with effective CPAP starting in December, 2024.  He's followed in the [facility].    He had a calcium score of 43 in 2022.  A repeat score recently came back at 80.    He's had a lot of trouble with statins and even Zetia  caused myalgia symptoms.    He isn't having angina, dyspnea, or other cardiac ischemic symptoms.         Vitals:    10/24/23 1326   BP: 131/78   BP Source: Arm, Left Upper   Pulse: 85   SpO2: 97%   O2 Device: None (Room air)   PainSc: Two   Weight: 103.2 kg (227 lb 9.6 oz)   Height: 180.3 cm (5' 11)     Body mass index is 31.74 kg/m?Joe Whitehead     Past Medical History  Patient Active Problem List    Diagnosis Date Noted    Class 2 severe obesity due to excess calories with serious comorbidity and body mass index (BMI) of 36.0 to 36.9 in adult (CMS-HCC) 10/24/2023    Coronary artery calcification      03/19/2010  Exercise Echo:  NSR PVC's noted throughout study.  Normal LV sz and function.  Normal hyperdynamic response for all segments.  No Echocardiographic evidence of ischemia.     10/11/2016-Exercise Echo [facility]): Normal sinus rhythm. Normal left ventricular size and systolic function with normal wall thickness art rest. No ECG evidence of ischemia. No echocardiographic evidence of ischemia.     09/11/2023-CT Cardiac Calcium Score ([facility]): Total calcium score is 80 (LAD=56; RCA=24). The calcium score on the piror study was 43.     09/11/2023-ECHO [facility]): Normal left ventricular cavitary dimensions with normal geometry. Normal left ventricular systolic function with a visually estimated ejection fraction of ~T5-6 %. Mildly dilated right ventricular cavitary size with normal RV systolic function. Normal left ventricular diastolic function.  Normal left atrial pressure. No left ventricular focal regional wall motion abnormality. Mild right atrial enlargement. No hemodynamically significant valvular stenosis or regurgitation. No obvious intracardiac vegetation or thrombus. Suboptimal TR jet precludes definitive assessment of PA systolic pressure. No pericardial effusion.      Excessive daytime sleepiness 05/04/2023    OSA (obstructive sleep apnea) 12/13/2022     Split night sleep study at [facility] on 08/12/11.   Sleep latency 2 minutes.  REM latency 86 minutes.     Baseline AHI 6, RDI 10/hr, SpO2 average 96%, SpO2 min 86%, and 1 minute < 90%.   No isolated or periodic limb movements were noted.   The patient was noted to have difficulty tolerating PAP machines. At a Bilevel pressure of 12/8 cm H20, he slept for 34 minutes, 7 minutes REM sleep and AHI/RDI was 2. Bilevel 12/8 cm H20 was recommended.       Home Sleep Test (01/10/23):  TST 425 minutes. Sleep efficiency 85%.  AHI4% 29.8, supine AHI4% 50.8, non-supine AHI4% 24.7.   AHI3% 42.5, supine AHI3% 74.7, non-supine AHI3%  34.8.   Baseline oxygen saturation 93.7%, oxygen saturation minimum 75%, time below 88% 28 minutes with 5.7 minutes < 85%. 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       Physical Exam    Physical Exam   General Appearance: no distress   Skin: warm, no ulcers or xanthomas   Digits and Nails: no cyanosis or clubbing   Eyes: conjunctivae and lids normal, pupils are equal and round   Teeth/Gums/Palate: dentition unremarkable, no lesions   Lips & Oral Mucosa: no pallor or cyanosis   Neck Veins: normal JVP , neck veins are not distended   Thyroid: no nodules, masses, tenderness or enlargement   Chest Inspection: chest is normal in appearance   Respiratory Effort: breathing comfortably, no respiratory distress   Auscultation/Percussion: lungs clear to auscultation, no rales or rhonchi, no wheezing   PMI: PMI not enlarged or displaced   Cardiac Rhythm: regular rhythm and normal rate   Cardiac Auscultation: S1, S2 normal, no rub, no gallop   Murmurs: no murmur   Peripheral Circulation: normal peripheral circulation   Carotid Arteries: normal carotid upstroke bilaterally, no bruits   Radial Arteries: normal symmetric radial pulses   Abdominal Aorta: no abdominal aortic bruit   Pedal Pulses: normal symmetric pedal pulses   Lower Extremity Edema: no lower extremity edema   Abdominal Exam: soft, non-tender, no masses, bowel sounds normal   Liver & Spleen: no organomegaly   Gait & Station: walks without assistance   Muscle Strength: normal muscle tone   Orientation: oriented to time, place and person   Affect & Mood: appropriate and sustained affect   Language and Memory: patient responsive and seems to comprehend information   Neurologic Exam: neurological assessment grossly intact   Other: moves all extremities        Cardiovascular Health Factors  Vitals BP Readings from Last 3 Encounters:   10/24/23 131/78   10/12/23 132/85   09/11/23 132/70     Wt Readings from Last 3 Encounters:   10/24/23 103.2 kg (227 lb 9.6 oz)   10/12/23 104.3 kg (230 lb)   09/11/23 107 kg (235 lb 14.3 oz)     BMI Readings from Last 3 Encounters:   10/24/23 31.74 kg/m?   10/12/23 32.08 kg/m?   09/11/23 32.90 kg/m?      Smoking Tobacco Use History[1]   Lipid Profile Cholesterol   Date Value Ref Range Status   05/08/2023 167  Final     HDL   Date Value Ref Range Status   05/08/2023 59  Final     LDL   Date Value Ref Range Status   05/08/2023 89  Final Triglycerides   Date Value Ref Range Status   05/08/2023 96  Final      Blood Sugar Hemoglobin A1C   Date Value Ref Range Status   04/01/2016 5.0  Final     Glucose   Date Value Ref Range Status   05/08/2023 104  Final   02/20/2023 97  Final   01/12/2016 87  Final          Problems Addressed Today  Encounter Diagnoses  Name Primary?    Observation for suspected cardiovascular disease Yes    Coronary artery calcification     OSA (obstructive sleep apnea)     Class 2 severe obesity due to excess calories with serious comorbidity and body mass index (BMI) of 36.0 to 36.9 in adult (CMS-HCC)        Assessment and Plan       Coronary artery calcification  We discussed the implications of coronary calcium and the strategy of plaque stabilization by lowering LDL.  He has a supply of Repatha  and I encouraged him to start the medication with a goal of LDL < 70 or lower.  I placed an order for a lipid profile in about 3 months.  I don't think he's having any symptoms that would prompt stress testing at this time.    OSA (obstructive sleep apnea)  He feels that his current CPAP set-up is working well.  We discussed the mild right-sided chamber enlargement on his recent echocardiogram and I told him that I think this finding is most likely related to his OSA and should improve with treatment.  I ordered a follow-up echocardiogram for about a year from now.    Class 2 severe obesity due to excess calories with serious comorbidity and body mass index (BMI) of 36.0 to 36.9 in adult (CMS-HCC)  Zepbound is working well for [employer] with ~25 pound weight loss so far.  His goal is a weight of around 200 pounds.      Current Medications (including today's revisions)   acetaminophen  SR (TYLENOL ) 650 mg tablet Take two tablets by mouth twice daily. (Patient taking differently: Take 1,000 mg by mouth twice daily.)    aspirin  EC (ASPIR-LOW) 81 mg tablet Take one tablet by mouth daily.    docusate (COLACE) 100 mg capsule Take one capsule by mouth daily.    ERGOCALCIFEROL (VITAMIN D2) (VITAMIN D PO) Take 2,000 Units by mouth daily.    eszopiclone (LUNESTA) 3 mg tablet Take one tablet by mouth at bedtime as needed for Sleep.    evolocumab  (REPATHA  SURECLICK) 140 mg/mL injectable PEN Inject 1 mL under the skin every 14 days.    finasteride (PROSCAR) 5 mg tablet Take one tablet by mouth daily.    HYDROcodone/acetaminophen  (NORCO) 7.5/325 mg tablet every 12 hours as needed.    mirabegron (MYRBETRIQ) 50 mg ER tablet Take one tablet by mouth daily.    tirzepatide (ZEPBOUND SC) Inject  under the skin.    ZEPBOUND 7.5 mg/0.5 mL injection pen Inject 0.5 mL under the skin every 7 days.     Total time spent on today's office visit was 45 minutes.  This includes face-to-face in person visit with patient as well as nonface-to-face time including review of the EMR, outside records, labs, radiologic studies, echocardiogram & other cardiovascular studies, formation of treatment plan, after visit summary, future disposition, and lastly on documentation.              [1]   Social History  Tobacco Use   Smoking Status Never   Smokeless Tobacco Never

## 2023-10-24 NOTE — Assessment & Plan Note
 He feels that his current CPAP set-up is working well.  We discussed the mild right-sided chamber enlargement on his recent echocardiogram and I told him that I think this finding is most likely related to his OSA and should improve with treatment.  I ordered a follow-up echocardiogram for about a year from now.

## 2023-10-24 NOTE — Patient Instructions
 Start Repatha   Check lipid profile in 3 months  Start aspirin  81 mg/day  Consider resistance training  Echocardiogram in one year  Good luck with weight target at 200

## 2023-11-02 ENCOUNTER — Encounter: Admit: 2023-11-02 | Discharge: 2023-11-02 | Payer: MEDICARE

## 2023-11-02 DIAGNOSIS — G4733 Obstructive sleep apnea (adult) (pediatric): Principal | ICD-10-CM

## 2023-11-02 NOTE — Telephone Encounter
-----   Message from JINNY Man, MD sent at 11/02/2023 10:19 AM CDT -----  Regarding: RE: Download  Leak has improved. AHI remains mildly elevated. If patient able to tolerate CPAP 16 cm H20, pressure can be adjusted to this.  Thanks.  ----- Message -----  From: Townsend Lye, LPN  Sent: 1/71/7974  10:14 AM CDT  To: Delon DELENA Man, MD  Subject: FW: Download                                     Download pulled and scanned in for review.  ----- Message -----  From: Townsend Lye, LPN  Sent: 1/71/7974  12:00 AM CDT  To: Lye Townsend, LPN  Subject: Download                                         Pull CPAP download and send to JL.

## 2023-11-02 NOTE — Telephone Encounter
 Changes to pressures were made and orders were placed for auth.

## 2023-11-23 ENCOUNTER — Encounter: Admit: 2023-11-23 | Discharge: 2023-11-23 | Payer: MEDICARE

## 2023-11-30 ENCOUNTER — Ambulatory Visit: Admit: 2023-11-30 | Discharge: 2023-12-01 | Payer: MEDICARE

## 2023-11-30 ENCOUNTER — Encounter: Admit: 2023-11-30 | Discharge: 2023-11-30 | Payer: MEDICARE

## 2023-11-30 VITALS — BP 142/81 | HR 67 | Temp 98.50000°F | Resp 16 | Ht 71.0 in | Wt 222.0 lb

## 2023-11-30 DIAGNOSIS — M48062 Spinal stenosis, lumbar region with neurogenic claudication: Principal | ICD-10-CM

## 2023-11-30 DIAGNOSIS — M47816 Spondylosis without myelopathy or radiculopathy, lumbar region: Secondary | ICD-10-CM

## 2023-11-30 DIAGNOSIS — M62838 Other muscle spasm: Secondary | ICD-10-CM

## 2023-11-30 DIAGNOSIS — M7918 Myalgia, other site: Secondary | ICD-10-CM

## 2023-11-30 DIAGNOSIS — M5416 Radiculopathy, lumbar region: Secondary | ICD-10-CM

## 2023-11-30 MED ORDER — IOHEXOL 240 MG IODINE/ML IV SOLN
1 mL | Freq: Once | EPIDURAL | 0 refills | Status: CP
Start: 2023-11-30 — End: ?
  Administered 2023-11-30: 14:00:00 1 mL via EPIDURAL

## 2023-11-30 MED ORDER — BUPIVACAINE (PF) 0.25 % (2.5 MG/ML) IJ SOLN
2 mL | Freq: Once | INTRAMUSCULAR | 0 refills | Status: CP
Start: 2023-11-30 — End: ?
  Administered 2023-11-30: 14:00:00 2 mL via INTRAMUSCULAR

## 2023-11-30 MED ORDER — TRIAMCINOLONE ACETONIDE 40 MG/ML IJ SUSP
80 mg | Freq: Once | EPIDURAL | 0 refills | Status: CP
Start: 2023-11-30 — End: ?
  Administered 2023-11-30: 14:00:00 80 mg via EPIDURAL

## 2023-11-30 MED ORDER — METHYLPREDNISOLONE 4 MG PO DSPK
ORAL_TABLET | ORAL | 0 refills | 6.00000 days | Status: AC
Start: 2023-11-30 — End: ?

## 2023-11-30 NOTE — Procedures
 Attending Surgeon: Royston LELON Bathe, MD    Anesthesia: Local    Pre-Procedure Diagnosis:   1. Lumbar stenosis with neurogenic claudication    2. Lumbar spondylosis    3. Lumbar radiculopathy        Post-Procedure Diagnosis:   1. Lumbar stenosis with neurogenic claudication    2. Lumbar spondylosis    3. Lumbar radiculopathy        Pain Score: Two    Trigger Point Injection  Locations: R piriformis    Consent:   Consent obtained: written    Discussed with patient the purpose of the treatment/procedure, other ways of treating my condition, including no treatment/ procedure and the risks and benefits of the alternatives. Patient has decided to proceed with treatment/procedure.   Procedures Details:   The trigger point is being used to treat myofascial pain caused by trigger points.  There is a focal area of pain in the skeletal muscle.  There is clinical evidence of a trigger point defined as pain in a skeletal muscle that is associated with at least 2 of the following findings: the presence of a hyperirritable spot and/or taut band identified by palpation and possible referred pain AND The physical examination identifies a focal hypersensitive bundle or nodule of muscle fiber harder than normal consistency with or without a local twitch response and referred pain.    Repeat trigger point injection in previously injected trigger points is medically necessary to treat myofascial pain syndrome due to a positive pain response from the most recent TPI defined as providing consistent minimum of 50% relief of primary (index) pain after the TPI AND Consistent pain relief from the most recent previous TPI lasting at least 6 weeks AND The myofascial pain has reoccurred and is causing objective functional limitations measured by a functional scale obtained at baseline and  after TPI which demonstrated at least 50% improvement from the previous TPI.    Indications: Movement of a joint or limb is limited or blocked    Prep: 2% chlorhexidine  Guidance: fluoroscopy  Needle size: 25 G  Number of muscles: 1 or 2    Approach: posterior    Patient tolerance: Patient tolerated the procedure well with no immediate complications. Pressure was applied, and hemostasis was accomplished.  Patient supplied medication: all medications  Comments: 4ml bupi and 40mg  depomedrol injected    Pre Procedure Pain: 8  Post Procedure Pain: 1        Administrations This Visit       bupivacaine  PF (MARCAINE ) 0.25 % injection 2 mL       Admin Date  11/30/2023 Action  Given Dose  2 mL Route  Injection Documented By  Oran Arabia, RN              iohexoL  (OMNIPAQUE -240) 240 mg/mL injection 1 mL       Admin Date  11/30/2023 Action  Given Dose  1 mL Route  Epidural Documented By  Oran Arabia, RN              triamcinolone  acetonide (KENALOG -40) injection 80 mg       Admin Date  11/30/2023 Action  Given Dose  80 mg Route  Epidural Documented By  Oran Arabia, RN                  Estimated blood loss: none or minimal  Specimens: none  Patient tolerated the procedure well with no immediate complications. Pressure was applied, and hemostasis was accomplished.

## 2023-11-30 NOTE — Procedures
 Attending Surgeon: Royston LELON Bathe, MD    Anesthesia: Local      [place] [facility] SPINE TRANSFORAMINAL LUMBAR/SACRAL THERAPEUTIC  Procedure: transforaminal epidural    Laterality: right   on 11/30/2023 8:00 AM  Location: lumbar - L2-3      Consent:   Consent obtained: written  Consent given by: patient    Discussed with patient the purpose of the treatment/procedure, other ways of treating my condition, including no treatment/ procedure and the risks and benefits of the alternatives. Patient has decided to proceed with treatment/procedure.   Procedures Details:   Indications: pain   Prep: chlorhexidine  Patient position: prone  Estimated Blood Loss: minimal  Specimens: none  Number of Levels: 1  Approach: right paramedian  Guidance: fluoroscopy  Contrast: Procedure confirmed with contrast under live fluoroscopy.  Needle and Epidural Catheter: pencil-tip  Needle size: 25 G  Injection procedure: Incremental injection, Negative aspiration for blood and Introduced needle  Patient tolerance: Patient tolerated the procedure well with no immediate complications. Pressure was applied, and hemostasis was accomplished.  Outcome: Pain relieved and Pain improved  Comments: 1ml bupi and 40mg  kenalog  injected  This patient's clinical history, exam, AND imaging support radiculopathy AND there is a significant impact on quality of life and function AND the pain has been present for at least 4 weeks AND they have failed to improve with noninvasive conservative care.    This patient failed to have improvement of their pain with epidural injection. Given the severity of their pain, a repeat injection is being performed at least 14 days later with a different Medication.        Estimated blood loss: none or minimal  Specimens: none  Patient tolerated the procedure well with no immediate complications. Pressure was applied, and hemostasis was accomplished.

## 2023-11-30 NOTE — Progress Notes

## 2023-11-30 NOTE — Progress Notes
 [facility]INE [facility]  INTERVENTIONAL PAIN PROCEDURE HISTORY AND PHYSICAL    Chief Complaint: Pain    HISTORY OF PRESENT ILLNESS:  rec back and rt leg pain  Also co rt buttock pain    Past Medical History:    Arthritis    Back pain    BPH (benign prostatic hyperplasia)    Chronic kidney disease    Chronic kidney disease, stage 3 (CMS-HCC)    Degenerative disc disease, cervical    Degenerative disc disease, lumbar    Degenerative disc disease, thoracic    DOE (dyspnea on exertion)    Dyslipidemia    Endocarditis    Gastric ulcer    GERD (gastroesophageal reflux disease)    Joint pain    Nerve injury    Observation for suspected cardiovascular disease    OSA (obstructive sleep apnea)    Osteoarthritis    Other and unspecified hyperlipidemia    Pancreatitis    Rheumatic fever    Seasonal allergic reaction    Skin cancer    Spinal stenosis       Surgical History:   Procedure Laterality Date    SPINE SURGERY  1997    R L5-S1 microdiscectomy    Colonoscopy N/A 01/03/2019    Performed by Leonce Emerick PARAS, DO at [facility] ENDO    COLONOSCOPY WITH SNARE REMOVAL TUMOR/ POLYP/ OTHER LESION  01/03/2019    Performed by Leonce Emerick PARAS, DO at [facility] ENDO    COLONOSCOPY WITH BIOPSY - FLEXIBLE  01/03/2019    Performed by Leonce Emerick PARAS, DO at [facility] ENDO    LAMINECTOMY WITH EXPLORATION/ DECOMPRESSION SPINAL CORD / CAUDA EQUINA - 1-2 SEGMENTS - LUMBAR Lumbar 3 Laminectomy, Minimally Invasive Surgery N/A 06/11/2021    Performed by Nicholaus Eva DASEN, MD at [facility]    COLONOSCOPY DIAGNOSTIC WITH SPECIMEN COLLECTION BY BRUSHING/ WASHING - FLEXIBLE N/A 11/04/2021    Performed by Leonce Emerick PARAS, DO at [facility] ENDO    COLONOSCOPY WITH SNARE REMOVAL TUMOR/ POLYP/ OTHER LESION  11/04/2021    Performed by Leonce Emerick PARAS, DO at [facility] ENDO    ARTHROPLASTY  07/13/2021    R knee arthroplasty    CLAVICLE SURGERY  13 and 17 years    X2 Left    ELBOW SURGERY  1974, 1989    EYE SURGERY  11/24    Blepharplasty    HX JOINT REPLACEMENT  07/13/21    I am 1 week post op from a R knee arthroplasty    HX VASECTOMY  1995    KNEE ARTHROSCOPY      bilateral    KNEE SURGERY  07/13/21    R knee arthroplasty    NERVE BIOPSY      PROSTATE SURGERY  2019    H/O TUR and Urolift    SHOULDER SURGERY  01/21/22 and again  04/23/22    R shoulder replacement    SURGERY  05/21/21    Dr Nicholaus BRADY    ULNAR TUNNEL RELEASE Right     URETHRA SURGERY         family history includes Arthritis in his father and mother; COPD in his father and mother; Cancer in his brother, father, and mother; Diabetes in his sister; Multiple sclerosis in his brother and another family member; Neurologic Disorder in his father and paternal aunt; Osteoporosis in his mother.    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  I continue alcohol free    Drug use: Never    Sexual activity: Yes     Partners: Female     Birth control/protection: Post-menopausal, None   Social History Narrative    Lives with wife       Allergies[1]    Vitals:    11/30/23 0802   BP: 136/76   BP Source: Arm, Right Upper   Pulse: 76   Temp: 36.9 ?C (98.5 ?F)   Resp: 16   SpO2: 98%   TempSrc: Oral   PainSc: Two   Weight: 100.7 kg (222 lb)   Height: 180.3 cm (5' 11)     Pain Score: Two  Oswestry Total Score:: (Patient-Rptd) 18    REVIEW OF SYSTEMS: 10 point ROS obtained and negative except pain      PHYSICAL EXAM:unchnaged        IMPRESSION:    1. Lumbar stenosis with neurogenic claudication    2. Lumbar spondylosis    3. Lumbar radiculopathy         PLAN: Lumbar Transforaminal Steroid Injection  Rt L2-3  And rt piriformis               [1]   Allergies  Allergen Reactions    Crestor  [Rosuvastatin ] MUSCLE PAIN     aching muscles

## 2024-01-19 ENCOUNTER — Encounter: Admit: 2024-01-19 | Discharge: 2024-01-19 | Payer: MEDICARE

## 2024-01-19 DIAGNOSIS — G4733 Obstructive sleep apnea (adult) (pediatric): Principal | ICD-10-CM

## 2024-01-29 ENCOUNTER — Encounter: Admit: 2024-01-29 | Discharge: 2024-01-29 | Payer: MEDICARE

## 2024-01-29 DIAGNOSIS — I2510 Atherosclerotic heart disease of native coronary artery without angina pectoris: Principal | ICD-10-CM

## 2024-01-29 LAB — LIPID PROFILE
CHOLESTEROL/HDL %: 2
CHOLESTEROL: 139
HDL: 68
LDL: 55
TRIGLYCERIDES: 81
VLDL: 16

## 2024-01-30 ENCOUNTER — Encounter: Admit: 2024-01-30 | Discharge: 2024-01-30 | Payer: MEDICARE

## 2024-01-30 DIAGNOSIS — M47816 Spondylosis without myelopathy or radiculopathy, lumbar region: Secondary | ICD-10-CM

## 2024-01-30 DIAGNOSIS — M48062 Spinal stenosis, lumbar region with neurogenic claudication: Principal | ICD-10-CM

## 2024-01-30 NOTE — Telephone Encounter [36]
 Results from 11/30/23 right L2-L3 TFESI. Patient reports 60 % relief for 8 weeks. Patient reports ability to increase activity levels, perform ADL's with less hindrance and decrease need for OTC pain medication. Pt rates pain at 7/10

## 2024-01-31 ENCOUNTER — Encounter: Admit: 2024-01-31 | Discharge: 2024-01-31 | Payer: MEDICARE

## 2024-01-31 DIAGNOSIS — M48062 Spinal stenosis, lumbar region with neurogenic claudication: Principal | ICD-10-CM

## 2024-01-31 DIAGNOSIS — M5416 Radiculopathy, lumbar region: Secondary | ICD-10-CM

## 2024-02-01 ENCOUNTER — Encounter: Admit: 2024-02-01 | Discharge: 2024-02-01 | Payer: MEDICARE

## 2024-02-08 ENCOUNTER — Encounter: Admit: 2024-02-08 | Discharge: 2024-02-08 | Payer: MEDICARE

## 2024-02-08 ENCOUNTER — Ambulatory Visit: Admit: 2024-02-08 | Discharge: 2024-02-09 | Payer: MEDICARE

## 2024-02-08 VITALS — BP 138/93 | HR 75 | Temp 98.60000°F | Resp 17 | Ht 71.0 in | Wt 222.0 lb

## 2024-02-08 DIAGNOSIS — M5416 Radiculopathy, lumbar region: Secondary | ICD-10-CM

## 2024-02-08 DIAGNOSIS — M47816 Spondylosis without myelopathy or radiculopathy, lumbar region: Secondary | ICD-10-CM

## 2024-02-08 DIAGNOSIS — M48062 Spinal stenosis, lumbar region with neurogenic claudication: Principal | ICD-10-CM

## 2024-02-08 MED ORDER — LIDOCAINE (PF) 10 MG/ML (1 %) IJ SOLN
2 mL | Freq: Once | INTRAMUSCULAR | 0 refills | Status: CP
Start: 2024-02-08 — End: ?
  Administered 2024-02-08: 15:00:00 2 mL via INTRAMUSCULAR

## 2024-02-08 MED ORDER — METHYLPREDNISOLONE ACETATE 80 MG/ML IJ SUSP
80 mg | Freq: Once | INTRAMUSCULAR | 0 refills | Status: CP
Start: 2024-02-08 — End: ?
  Administered 2024-02-08: 15:00:00 80 mg via INTRAMUSCULAR

## 2024-02-08 MED ORDER — IOHEXOL 240 MG IODINE/ML IV SOLN
1 mL | Freq: Once | EPIDURAL | 0 refills | Status: CP
Start: 2024-02-08 — End: ?
  Administered 2024-02-08: 15:00:00 1 mL via EPIDURAL

## 2024-02-08 NOTE — Progress Notes [1]
 SPINE [facility]ENTER  INTERVENTIONAL PAIN PROCEDURE HISTORY AND PHYSICAL    Chief Complaint: Pain    HISTORY OF PRESENT ILLNESS:  rec back pain  Rt side  Piriformis has improved  Increased pain in the morning with stiffenss  Pain worse after various activities  Injections help for a time  Was able to travel to [place] and walk 20,000 steps without a lot of trouble  Takes tramadol  Sp lum lami L3-4 approx 3 yrs ago with Dr Nicholaus  Cont stenosis at this level    Past Medical History:    Arthritis    Back pain    BPH (benign prostatic hyperplasia)    Chronic kidney disease    Chronic kidney disease, stage 3 (CMS-HCC)    Degenerative disc disease, cervical    Degenerative disc disease, lumbar    Degenerative disc disease, thoracic    DOE (dyspnea on exertion)    Dyslipidemia    Endocarditis    Gastric ulcer    GERD (gastroesophageal reflux disease)    Joint pain    Nerve injury    Observation for suspected cardiovascular disease    OSA (obstructive sleep apnea)    Osteoarthritis    Other and unspecified hyperlipidemia    Pancreatitis    Rheumatic fever    Seasonal allergic reaction    Skin cancer    Spinal stenosis       Surgical History:   Procedure Laterality Date    SPINE SURGERY  1997    R L5-S1 microdiscectomy    Colonoscopy N/A 01/03/2019    Performed by Leonce Emerick PARAS, DO at [facility] ENDO    COLONOSCOPY WITH SNARE REMOVAL TUMOR/ POLYP/ OTHER LESION  01/03/2019    Performed by Leonce Emerick PARAS, DO at [facility] ENDO    COLONOSCOPY WITH BIOPSY - FLEXIBLE  01/03/2019    Performed by Leonce Emerick PARAS, DO at [facility] ENDO    LAMINECTOMY WITH EXPLORATION/ DECOMPRESSION SPINAL CORD / CAUDA EQUINA - 1-2 SEGMENTS - LUMBAR Lumbar 3 Laminectomy, Minimally Invasive Surgery N/A 06/11/2021    Performed by Nicholaus Eva DASEN, MD at [facility]    COLONOSCOPY DIAGNOSTIC WITH SPECIMEN COLLECTION BY BRUSHING/ WASHING - FLEXIBLE N/A 11/04/2021    Performed by Leonce Emerick PARAS, DO at [facility] ENDO    COLONOSCOPY WITH SNARE REMOVAL TUMOR/ POLYP/ OTHER LESION  11/04/2021 Performed by Leonce Emerick PARAS, DO at [facility] ENDO    ARTHROPLASTY  07/13/2021    R knee arthroplasty    CLAVICLE SURGERY  13 and 17 years    X2 Left    ELBOW SURGERY  1974, 1989    EYE SURGERY  11/24    Blepharplasty    HX JOINT REPLACEMENT  07/13/21    I am 1 week post op from a R knee arthroplasty    HX VASECTOMY  1995    KNEE ARTHROSCOPY      bilateral    KNEE SURGERY  07/13/21    R knee arthroplasty    NERVE BIOPSY      PROSTATE SURGERY  2019    H/O TUR and Urolift    SHOULDER SURGERY  01/21/22 and again  04/23/22    R shoulder replacement    SURGERY  05/21/21    Dr Nicholaus BRADY    ULNAR TUNNEL RELEASE Right     URETHRA SURGERY         family history includes Arthritis in his father and mother; COPD in his father and mother; Cancer in  his brother, father, and mother; Diabetes in his sister; Multiple sclerosis in his brother and another family member; Neurologic Disorder in his father and paternal aunt; Osteoporosis in his mother.    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  I continue alcohol free    Drug use: Never    Sexual activity: Yes     Partners: Female     Birth control/protection: Post-menopausal, None   Social History Narrative    Lives with wife       Allergies[1]    There were no vitals filed for this visit.     Oswestry Total Score:: (Patient-Rptd) 28    REVIEW OF SYSTEMS: 10 point ROS obtained and negative except pain      PHYSICAL EXAM:unchnaged        IMPRESSION:    1. Lumbar radiculopathy    2. Lumbar stenosis with neurogenic claudication    3. Lumbar spondylosis         PLAN: Lumbar Transforaminal Steroid Injection             [1]   Allergies  Allergen Reactions    Crestor  [Rosuvastatin ] MUSCLE PAIN     aching muscles

## 2024-02-08 NOTE — Progress Notes [1]

## 2024-02-08 NOTE — Procedures [3]
 Attending Surgeon: Royston LELON Bathe, MD    Anesthesia: Local      [place] [facility] TRANSFORAMINAL LUMBAR/SACRAL THERAPEUTIC  Procedure: transforaminal epidural    Laterality: right   on 02/08/2024 8:00 AM  Location: lumbar - L2-3 and L3-4      Consent:   Consent obtained: written  Consent given by: patient    Discussed with patient the purpose of the treatment/procedure, other ways of treating my condition, including no treatment/ procedure and the risks and benefits of the alternatives. Patient has decided to proceed with treatment/procedure.   Procedures Details:   Indications: pain   Prep: chlorhexidine  Patient position: prone  Estimated Blood Loss: minimal  Specimens: none  Number of Levels: 2  Approach: right paramedian  Guidance: fluoroscopy  Contrast: Procedure confirmed with contrast under live fluoroscopy.  Needle and Epidural Catheter: pencil-tip  Needle size: 25 G  Injection procedure: Incremental injection, Negative aspiration for blood and Introduced needle  Patient tolerance: Patient tolerated the procedure well with no immediate complications. Pressure was applied, and hemostasis was accomplished.  Outcome: Pain relieved and Pain improved  Comments: 1ml lido and 40mg  depomedrol injected at each level  This patient's clinical history, exam, AND imaging support radiculopathy AND there is a significant impact on quality of life and function AND the pain has been present for at least 4 weeks AND they have failed to improve with noninvasive conservative care.  This patient had at least 50% pain relief for at least 3 months with the last epidural injection.          Estimated blood loss: none or minimal  Specimens: none  Patient tolerated the procedure well with no immediate complications. Pressure was applied, and hemostasis was accomplished.

## 2024-02-08 NOTE — Progress Notes [1]
 During timeout, discussed with patient and provider about change in pain and plan to instead do a 2-level TFESI and no piriformis. Patient and provider in agreement, verbal consent from patient obtained. Proceeded with 2 level transforaminal Right side L2-3, L3-4 TFESI. Patient aware of risks and provided verbal consent to procedure.

## 2024-02-08 NOTE — Patient Instructions [37]
 Procedure Completed Today: Lumbar Transforaminal Steroid Injection    Important information following your procedure today: You may drive today    Pain relief may not be immediate. It is possible you may even experience an increase in pain during the first 24-48 hours followed by a gradual decrease of your pain.  Though the procedure is generally safe and complications are rare, we do ask that you be aware of any of the following:   Any swelling, persistent redness, new bleeding, or drainage from the site of the injection.  You should not experience a severe headache.  You should not run a fever over 101? F.  New onset of sharp, severe back & or neck pain.  New onset of upper or lower extremity numbness or weakness.  New difficulty controlling bowel or bladder function after the injection.  New shortness of breath.    If any of these occur, please call to report this occurrence to Dr. Welton Flakes at [phone]. If you are calling after 4:00 p.m., on weekends or holidays please call [phone] and ask to have the resident physician on call for the physician paged or go to your local emergency room.  You may experience soreness at the injection site. Ice can be applied at 20 minute intervals. Avoid application of direct heat, hot showers or hot tubs today.  Avoid strenuous activity today. You may resume your regular activities and exercise tomorrow.  Patients with diabetes may see an elevation in blood sugars for 7-10 days after the injection. It is important to pay close attention to your diet, check your blood sugars daily and report extreme elevations to the physician that treats your diabetes.  Patients taking a daily blood thinner can resume their regular dose this evening.  It is important that you take all medications ordered by your pain physician. Taking medication as ordered is an important part of your pain care plan. If you cannot continue the medication plan, please notify the physician.     Possible side effects to steroids that may occur:  Flushing or redness of the face  Irritability  Fluid retention  Change in women?s menses    The following medications were used: Lidocaine , Depomedrol, and Contrast Dye

## 2024-03-15 ENCOUNTER — Encounter: Admit: 2024-03-15 | Discharge: 2024-03-15 | Payer: MEDICARE

## 2024-03-15 DIAGNOSIS — M25552 Pain in left hip: Principal | ICD-10-CM

## 2024-03-15 NOTE — Telephone Encounter [36]
 Calling pt to scheduled left hip injection. Offered 03/18/24 at [facility] with Dr. Fernand. Left nurse call back line #

## 2024-03-18 ENCOUNTER — Encounter: Admit: 2024-03-18 | Discharge: 2024-03-18 | Payer: MEDICARE

## 2024-03-18 ENCOUNTER — Ambulatory Visit: Admit: 2024-03-18 | Discharge: 2024-03-18 | Payer: MEDICARE

## 2024-03-18 ENCOUNTER — Ambulatory Visit: Admit: 2024-03-18 | Discharge: 2024-03-19 | Payer: MEDICARE

## 2024-03-18 VITALS — BP 118/75 | HR 62 | Temp 97.70000°F | Ht 71.0 in | Wt 218.0 lb

## 2024-03-18 DIAGNOSIS — M25552 Pain in left hip: Principal | ICD-10-CM

## 2024-03-18 DIAGNOSIS — M1612 Unilateral primary osteoarthritis, left hip: Secondary | ICD-10-CM

## 2024-03-18 MED ORDER — IOHEXOL 300 MG IODINE/ML IV SOLN
1 mL | Freq: Once | 0 refills | Status: CP
Start: 2024-03-18 — End: ?

## 2024-03-18 MED ORDER — BUPIVACAINE (PF) 0.25 % (2.5 MG/ML) IJ SOLN
5 mL | Freq: Once | INTRAMUSCULAR | 0 refills | Status: CP
Start: 2024-03-18 — End: ?

## 2024-03-18 MED ORDER — METHYLPREDNISOLONE ACETATE 40 MG/ML IJ SUSP
80 mg | Freq: Once | INTRAMUSCULAR | 0 refills | Status: CP
Start: 2024-03-18 — End: ?

## 2024-03-18 NOTE — Procedures [3]
 Attending Surgeon: Royston LELON Bathe, MD    Anesthesia: Local    Pre-Procedure Diagnosis:   1. Primary osteoarthritis of left hip    2. Pain of left hip        Post-Procedure Diagnosis:   1. Primary osteoarthritis of left hip    2. Pain of left hip             Large Joint Drain/Inject (Procedure): L hip joint    Consent:   Consent obtained: written  Consent given by: patient  Risks discussed: tendon rupture, subcutaneous fat atrophy, arrhythmia, bleeding, damage to surrounding structures and hyperglycemia  Alternatives discussed: alternative treatment, delayed treatment, no treatment and referral  Discussed with patient the purpose of the treatment/procedure, other ways of treating my condition, including no treatment/ procedure and the risks and benefits of the alternatives. Patient has decided to proceed with treatment/procedure.   Procedures Details:  Procedure Peformed: Injection Only  Indications: pain and joint swelling  Details:Prep: 2% chlorhexidine   25 G needle, fluoroscopy-guided       lateral approachOutcome: tolerated well, no immediate complications  Comments: 5ml bupi and 80mg  depomedrol injected        Estimated blood loss: none or minimal  Specimens: none  Patient tolerated the procedure well with no immediate complications. Pressure was applied, and hemostasis was accomplished.

## 2024-03-18 NOTE — Progress Notes [1]
 [facility]  INTERVENTIONAL PAIN PROCEDURE HISTORY AND PHYSICAL    Chief Complaint: Pain    HISTORY OF PRESENT ILLNESS:  left hip and groin pain  Last seen for lumbar injection with good relief  Cont pain left hip and groin  Ho OA  Tried conservative care    Past Medical History:    Arthritis    Back pain    BPH (benign prostatic hyperplasia)    Chronic kidney disease    Chronic kidney disease, stage 3 (CMS-HCC)    Degenerative disc disease, cervical    Degenerative disc disease, lumbar    Degenerative disc disease, thoracic    DOE (dyspnea on exertion)    Dyslipidemia    Endocarditis    Gastric ulcer    GERD (gastroesophageal reflux disease)    Joint pain    Nerve injury    Observation for suspected cardiovascular disease    OSA (obstructive sleep apnea)    Osteoarthritis    Other and unspecified hyperlipidemia    Pancreatitis    Rheumatic fever    Seasonal allergic reaction    Skin cancer    Spinal stenosis       Surgical History:   Procedure Laterality Date    SPINE SURGERY  1997    R L5-S1 microdiscectomy    Colonoscopy N/A 01/03/2019    Performed by Leonce Emerick PARAS, DO at [facility] ENDO    COLONOSCOPY WITH SNARE REMOVAL TUMOR/ POLYP/ OTHER LESION  01/03/2019    Performed by Leonce Emerick PARAS, DO at [facility] ENDO    COLONOSCOPY WITH BIOPSY - FLEXIBLE  01/03/2019    Performed by Leonce Emerick PARAS, DO at [facility] ENDO    LAMINECTOMY WITH EXPLORATION/ DECOMPRESSION SPINAL CORD / CAUDA EQUINA - 1-2 SEGMENTS - LUMBAR Lumbar 3 Laminectomy, Minimally Invasive Surgery N/A 06/11/2021    Performed by Nicholaus Eva DASEN, MD at [facility] OR    COLONOSCOPY DIAGNOSTIC WITH SPECIMEN COLLECTION BY BRUSHING/ WASHING - FLEXIBLE N/A 11/04/2021    Performed by Leonce Emerick PARAS, DO at [facility] ENDO    COLONOSCOPY WITH SNARE REMOVAL TUMOR/ POLYP/ OTHER LESION  11/04/2021    Performed by Leonce Emerick PARAS, DO at [facility] ENDO    ARTHROPLASTY  07/13/2021    R knee arthroplasty    CLAVICLE SURGERY  13 and 17 years    X2 Left    ELBOW SURGERY  1974, 1989    EYE SURGERY 11/24    Blepharplasty    HX JOINT REPLACEMENT  07/13/21    I am 1 week post op from a R knee arthroplasty    HX VASECTOMY  1995    KNEE ARTHROSCOPY      bilateral    KNEE SURGERY  07/13/21    R knee arthroplasty    NERVE BIOPSY      PROSTATE SURGERY  2019    H/O TUR and Urolift    SHOULDER SURGERY  01/21/22 and again  04/23/22    R shoulder replacement    SURGERY  05/21/21    Dr Nicholaus BRADY    ULNAR TUNNEL RELEASE Right     URETHRA SURGERY         family history includes Arthritis in his father and mother; COPD in his father and mother; Cancer in his brother, father, and mother; Diabetes in his sister; Multiple sclerosis in his brother and another family member; Neurologic Disorder in his father and paternal aunt; Osteoporosis in his mother.    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  I continue alcohol free    Drug use: Never    Sexual activity: Yes     Partners: Female     Birth control/protection: Post-menopausal, None   Social History Narrative    Lives with wife       Allergies[1]    There were no vitals filed for this visit.          REVIEW OF SYSTEMS: 10 point ROS obtained and negative except pain      PHYSICAL EXAM:unchnaged        IMPRESSION:    1. Primary osteoarthritis of left hip    2. Pain of left hip         PLAN: Other left hip inj               [1]   Allergies  Allergen Reactions    Crestor  [Rosuvastatin ] MUSCLE PAIN     aching muscles

## 2024-03-18 NOTE — Discharge Instr - Other Info [68]
 GENERAL POST PROCEDURE INSTRUCTIONS  Physician: _________________________________  Procedure Completed Today:  Joint Injection (hip, knee, shoulder)  Cervical Epidural Steroid Injection  Cervical Transforaminal Steroid Injection  Trigger Point Injection  Caudal Epidural Steroid Injection  Piriformis Injection  Pudendal Nerve Block  Other _____________________ Thoracic Epidural Steroid Injection  Lumbar Epidural Steroid Injection  Lumbar Transforaminal Steroid Injection  Facet Joint Injection  Celiac Nerve Block  Sacrococcygeal  Sacroiliac Joint Injection   Important information following your procedure today:  You may drive today     If you had sedation, you may NOT drive today  Rest at home for the next 6 hours.  You may then begin to resume your normal activities.  DO NOT drive any vehicle, operate any power tools, drink alcohol, make any major decisions, or sign any legal documents for the next 12 hours.  Pain relief may not be immediate. It is possible you may even experience an increase in pain during the first 24-48 hours followed by a gradual decrease of your pain.  Though the procedure is generally safe, and complications are rare, we do ask that you be aware of any of the following:  Any swelling, persistent redness, new bleeding or drainage from the site of the injection.  You should not experience a severe headache.  You should not run a fever over 101oF.  New onset of sharp, severe back and or neck pain.  New onset of upper or lower extremity numbness or weakness.  New difficulty controlling bowel or bladder function after injection.  New shortness of breath.  ** If any of these occur, please call to report this occurrence to the nurse of Dr. Welton Flakes at [phone]. If you are calling after 4:00 p.m. or on weekends or holidays, please call [phone] and ask to have the resident physician on call for the physician paged or go to your local emergency room.  You may experience soreness at the injection site. Ice can be applied at 20-minute intervals for the first 24 hours. The following day you may alternate ice with heat if you are experiencing muscle tightness, otherwise continue with ice. Ice works best at decreasing pain. Avoid application of direct heat, hot showers or hot tubs today.  Avoid strenuous activity today. You many resume your regular activities and exercise tomorrow.  Patients with diabetes may see an elevation in blood sugars for 7-10 days after the injection. It is important to pay close attention to your diet, check your blood sugars daily and report extreme elevations to the physician that manages your diabetes.  Patients taking daily blood thinners can resume their regular dose this evening.  It is important that you take all medications ordered by your pain physician. Taking medications as ordered is an important part of your pain care plan. If you cannot continue the medication plan, please notify the physician.    Possible side effects to steroids that may occur:  Flushing or redness of the face  Irritability  Fluid retention  Change in women's menses  Minor headache    If you are unable to keep your upcoming appointment, please notify the [facility] scheduler at [phone] at least 24 hours in advance. If you have questions for the surgery center, call [facility] at [phone].

## 2024-03-20 ENCOUNTER — Encounter: Admit: 2024-03-20 | Discharge: 2024-03-20 | Payer: MEDICARE
